# Patient Record
Sex: Female | Born: 1945 | Race: White | Hispanic: No | Marital: Married | Smoking: Never smoker
Health system: Southern US, Community
[De-identification: ages and names within clinical notes are randomized; demographics above are authoritative.]

## PROBLEM LIST (undated history)

## (undated) DIAGNOSIS — E119 Type 2 diabetes mellitus without complications: Secondary | ICD-10-CM

## (undated) DIAGNOSIS — E785 Hyperlipidemia, unspecified: Secondary | ICD-10-CM

## (undated) DIAGNOSIS — M179 Osteoarthritis of knee, unspecified: Secondary | ICD-10-CM

## (undated) DIAGNOSIS — M1710 Unilateral primary osteoarthritis, unspecified knee: Secondary | ICD-10-CM

## (undated) DIAGNOSIS — I10 Essential (primary) hypertension: Secondary | ICD-10-CM

## (undated) HISTORY — DX: Unilateral primary osteoarthritis, unspecified knee: M17.10

## (undated) HISTORY — DX: Type 2 diabetes mellitus without complications: E11.9

## (undated) HISTORY — DX: Hyperlipidemia, unspecified: E78.5

## (undated) HISTORY — DX: Osteoarthritis of knee, unspecified: M17.9

---

## 2001-11-06 ENCOUNTER — Encounter: Payer: Self-pay | Admitting: Family Medicine

## 2001-11-06 ENCOUNTER — Ambulatory Visit (HOSPITAL_COMMUNITY): Admission: RE | Admit: 2001-11-06 | Discharge: 2001-11-06 | Payer: Self-pay | Admitting: Family Medicine

## 2002-11-19 ENCOUNTER — Encounter: Payer: Self-pay | Admitting: Family Medicine

## 2002-11-19 ENCOUNTER — Ambulatory Visit (HOSPITAL_COMMUNITY): Admission: RE | Admit: 2002-11-19 | Discharge: 2002-11-19 | Payer: Self-pay | Admitting: Family Medicine

## 2003-11-15 ENCOUNTER — Ambulatory Visit (HOSPITAL_COMMUNITY): Admission: RE | Admit: 2003-11-15 | Discharge: 2003-11-15 | Payer: Self-pay | Admitting: Family Medicine

## 2004-11-09 ENCOUNTER — Ambulatory Visit (HOSPITAL_COMMUNITY): Admission: RE | Admit: 2004-11-09 | Discharge: 2004-11-09 | Payer: Self-pay | Admitting: Family Medicine

## 2004-11-19 ENCOUNTER — Ambulatory Visit (HOSPITAL_COMMUNITY): Admission: RE | Admit: 2004-11-19 | Discharge: 2004-11-19 | Payer: Self-pay | Admitting: Family Medicine

## 2004-12-26 ENCOUNTER — Ambulatory Visit (HOSPITAL_COMMUNITY): Admission: RE | Admit: 2004-12-26 | Discharge: 2004-12-26 | Payer: Self-pay | Admitting: Family Medicine

## 2005-07-31 ENCOUNTER — Ambulatory Visit (HOSPITAL_COMMUNITY): Admission: RE | Admit: 2005-07-31 | Discharge: 2005-07-31 | Payer: Self-pay | Admitting: Family Medicine

## 2006-01-29 ENCOUNTER — Ambulatory Visit (HOSPITAL_COMMUNITY): Admission: RE | Admit: 2006-01-29 | Discharge: 2006-01-29 | Payer: Self-pay | Admitting: Family Medicine

## 2010-03-12 ENCOUNTER — Encounter: Payer: Self-pay | Admitting: Orthopedic Surgery

## 2010-03-12 ENCOUNTER — Ambulatory Visit (HOSPITAL_COMMUNITY): Admission: RE | Admit: 2010-03-12 | Discharge: 2010-03-12 | Payer: Self-pay | Admitting: Family Medicine

## 2010-03-13 ENCOUNTER — Ambulatory Visit (HOSPITAL_COMMUNITY): Admission: RE | Admit: 2010-03-13 | Discharge: 2010-03-13 | Payer: Self-pay | Admitting: Family Medicine

## 2010-04-13 ENCOUNTER — Ambulatory Visit (HOSPITAL_COMMUNITY): Admission: RE | Admit: 2010-04-13 | Discharge: 2010-04-13 | Payer: Self-pay | Admitting: Family Medicine

## 2010-04-13 ENCOUNTER — Encounter: Payer: Self-pay | Admitting: Orthopedic Surgery

## 2010-05-10 ENCOUNTER — Encounter: Payer: Self-pay | Admitting: Orthopedic Surgery

## 2010-05-10 ENCOUNTER — Ambulatory Visit (HOSPITAL_COMMUNITY)
Admission: RE | Admit: 2010-05-10 | Discharge: 2010-05-10 | Payer: Self-pay | Source: Home / Self Care | Admitting: Family Medicine

## 2010-06-06 ENCOUNTER — Ambulatory Visit: Payer: Self-pay | Admitting: Orthopedic Surgery

## 2010-06-06 ENCOUNTER — Encounter (INDEPENDENT_AMBULATORY_CARE_PROVIDER_SITE_OTHER): Payer: Self-pay | Admitting: *Deleted

## 2010-06-06 DIAGNOSIS — G5790 Unspecified mononeuropathy of unspecified lower limb: Secondary | ICD-10-CM | POA: Insufficient documentation

## 2010-06-08 ENCOUNTER — Encounter: Payer: Self-pay | Admitting: Orthopedic Surgery

## 2010-06-20 ENCOUNTER — Encounter: Payer: Self-pay | Admitting: Orthopedic Surgery

## 2010-06-22 ENCOUNTER — Telehealth: Payer: Self-pay | Admitting: Orthopedic Surgery

## 2010-07-04 ENCOUNTER — Ambulatory Visit: Payer: Self-pay | Admitting: Orthopedic Surgery

## 2010-07-11 ENCOUNTER — Encounter: Payer: Self-pay | Admitting: Orthopedic Surgery

## 2010-09-24 ENCOUNTER — Ambulatory Visit (HOSPITAL_COMMUNITY)
Admission: RE | Admit: 2010-09-24 | Discharge: 2010-09-24 | Payer: Self-pay | Source: Home / Self Care | Attending: Family Medicine | Admitting: Family Medicine

## 2010-10-02 NOTE — Assessment & Plan Note (Signed)
Summary: 4WK RE-CK LT LEG/BCBS/CAF   Visit Type:  Follow-up Referring Provider:  Dr. Renard Matter Primary Provider:  Dr. Renard Matter  CC:  left leg pain.  History of Present Illness: I saw Sherry Weiss in the office today for a 4 week followup visit.  She is a 65 years old woman with the complaint of:  left leg pain.  MRI L SPINE 05/10/10, [facility]/ this indicated that she had a small facet arthritis at L5 and S1. No other spinal abnormality noted.  L spine xrays 04/13/10, [facility]  Left foot and ankle xrays 03/12/10 [facility]  Meds: Crestor, Kombiqlyze, ASA, Vicodin es given from PCP, did not take, Neurontin 100 mg. Takes Tramadol   She says that she has good days and bad days. She has has some numbness in her calf and foot.  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 However, could be lumbar spine-related.  Recommend increased Neurontin 200 mg at night, continue tramadol.  Since she has no surgical lesion. We will allow her to followup with her medical doctor for further care unless things get worse. She is out of work until the 21st.  Orders: Est. Patient Level II [id number])  Medications Added to Medication List This Visit: 1)  Neurontin 100 Mg Caps (Gabapentin) .... 2  by mouth at bedtime  Patient Instructions: 1)  Increase your neurontin to 200 mg at night  2)  continue the tramadol  3)  f/u with Dr. Renard Matter as needed  Prescriptions: NEURONTIN 100 MG CAPS (GABAPENTIN) 2  by mouth at bedtime  #60 x 1   Entered and Authorized by:   Fuller Canada MD   Signed by:   Fuller Canada MD on 07/04/2010   Method used:   Faxed to ...       [facility]* (retail)       [address]       [place], [place]  [address]       Ph: [phone] or  [phone]       Fax: [phone]   RxID:   [id number]    Orders Added: 1)  Est. Patient Level II [66440]

## 2010-10-02 NOTE — Letter (Signed)
Summary: Out of Work  [facility]  [address]  [place], [place] [address]   Phone: [phone]  Fax: [phone]    July 04, 2010   Employee:  Marylouise Stacks P Sami    To Whom It May Concern:   For Medical reasons, please excuse the above named employee from work for the following dates:  Start:   Nov 5   End:   Nov 21st   If you need additional information, please feel free to contact our office.         Sincerely,    Fuller Canada MD

## 2010-10-02 NOTE — Letter (Signed)
Summary: [employer] dis[employer] form  [employer] dis[employer] form   Imported By: Cammie Sickle 07/10/2010 17:56:47  _____________________________________________________________________  External Attachment:    Type:   Image     Comment:   External Document

## 2010-10-02 NOTE — Letter (Signed)
Summary: [employer] dis[employer] form  [employer] form   Imported By: Cammie Sickle 07/10/2010 17:57:49  _____________________________________________________________________  External Attachment:    Type:   Image     Comment:   External Document

## 2010-10-02 NOTE — Assessment & Plan Note (Signed)
Summary: EVAL/TREAT LT LEG PAIN,RAD DOWN TO FOOT/NEED XRAY/BRING'G MRI...   Vital Signs:  Patient profile:   65 year old female Height:      59 inches Weight:      123 pounds Pulse rate:   70 / minute Resp:     16 per minute  Vitals Entered By: Fuller Canada MD (June 06, 2010 10:09 AM)  Visit Type:  new patient Referring Colen Eltzroth:  Dr. Renard Matter Primary Tristram Milian:  Dr. Renard Matter  CC:  left leg pain.  History of Present Illness: I saw Sherry Weiss in the office today for an initial visit.  She is a 65 years old woman with the complaint of:  left leg pain.  MRI L SPINE 05/10/10, [facility]  L spine xrays 04/13/10, [facility]  Left foot and ankle xrays 03/12/10 [facility]  Meds: Crestor, Kombiqlyze, ASA, Vicodin es given from PCP, did not take, Advil 400mg  helps some.  Very interesting case and referral.  The patient complains of pain in her LEFT leg and LEFT foot which radiates.  Its described as throbbing and burning and reaches a level of 10.  It has become constant it started about 2 months ago when she fell forward while moving a heavy object.  At that time she felt pain in her back and since that time his had some numbness in her toe is well.  That's the great toe the LEFT foot.  She says nothing makes it better standing and walking make it worse.  She was treated with Advil 400 mg mild relief and Aleve as well.  She did not take the Vicodin ES.  Denies catching locking of the back she does note some LEFT leg weakness has no cramps.  No fever night sweats;   weight loss is a note unexpected weight loss.  No weight gain.  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  Review of Systems Constitutional:  Complains of weight loss; denies weight gain, fever, chills, and fatigue. Cardiovascular:  Denies chest pain, palpitations, fainting, and murmurs. Respiratory:  Denies short of breath, wheezing, couch, tightness, pain on inspiration, and snoring . Gastrointestinal:  Denies heartburn, nausea, vomiting, diarrhea, constipation, and blood in your stools. Genitourinary:  Denies frequency, urgency, difficulty urinating, painful urination, flank pain, and bleeding in urine. Neurologic:  Denies numbness, tingling, unsteady gait, dizziness, tremors, and seizure. Musculoskeletal:  Complains of joint pain and muscle pain; denies swelling, instability, stiffness, redness, and heat. Endocrine:  Denies excessive thirst, exessive urination, and heat or cold intolerance. Psychiatric:  Denies nervousness, depression, anxiety, and hallucinations. Skin:  Denies changes in the skin, poor healing, rash, itching, and redness. HEENT:  Denies blurred or double vision, eye pain, redness, and watering. Immunology:  Denies seasonal allergies, sinus problems, and allergic to bee stings. Hemoatologic:  Denies easy bleeding and brusing.  Physical Exam  Additional Exam:  GEN: well developed, well nourished, normal grooming and hygiene, no deformity and normal body habitus.   CDV: pulses are normal, no edema, no erythema. no tenderness  Lymph: normal  lymph nodes   Skin: no rashes, skin lesions or open sores   NEURO: normal coordination  Psyche: awake, alert and oriented. Mood normal   Gait: his normal  She has no tenderness in her lumbar spine.  Her lower extremities reveal no abnormality to inspection she has full range of motion of all joints she has normal strength in the lower extremities with no instability or subluxation noted  She does have an  asymmetry of reflexes and decreased sensation in the great toe and between the first and second digit of LEFT foot  she has some symptoms on the straight leg raise maneuver on the LEFT none on the RIGHT.   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  Recommend Neurontin 100 mg at night come back 4 weeks.  She says she can't work she can't walk she can't stand so she would like to be out of work so I agreed based on her request  Orders: New Patient Level III ([address])  Medications Added to Medication List This Visit: 1)  Neurontin 100 Mg Caps (Gabapentin) .Marland Kitchen.. 1 by mouth at bedtime  Patient Instructions: 1)  Take new med at bedtime for nerve pain in your leg 2)  come back in 4 weeks 3)  Out of work for 4 weeks Prescriptions: NEURONTIN 100 MG CAPS (GABAPENTIN) 1 by mouth at bedtime  #60 x 2   Entered and Authorized by:   Fuller Canada MD   Signed by:   Fuller Canada MD on 06/06/2010   Method used:   Faxed to ...       [facility]* (retail)       [address]       [place], [place]  [address]       Ph: [phone] or [phone]       Fax: [phone]   RxID:   [id number]

## 2010-10-02 NOTE — Letter (Signed)
Summary: History form  History form   Imported By: Jacklynn Ganong 06/08/2010 08:26:57  _____________________________________________________________________  External Attachment:    Type:   Image     Comment:   External Document

## 2010-10-02 NOTE — Progress Notes (Signed)
Summary: wants pain medicine  Phone Note Call from Patient   Summary of Call: Sherry Weiss (Jan 15, 2046) says  Advil and Aleve is not helping her foot pain.  Ask if you will prescribe a mild pain medicine, does not want anything that will make her drowsy.  Uses [facility]. Her # [id number] Initial call taken by: Jacklynn Ganong,  June 22, 2010 10:06 AM  Follow-up for Phone Call        call her doctor  Follow-up by: Fuller Canada MD,  June 25, 2010 7:37 AM  Additional Follow-up for Phone Call Additional follow up Details #1::        Advised the patient of reply Additional Follow-up by: Jacklynn Ganong,  June 25, 2010 9:17 AM

## 2010-10-02 NOTE — Letter (Signed)
Summary: Office note faxed to [employer]  Office note faxed to [employer]   Imported By: Jacklynn Ganong 07/11/2010 12:05:20  _____________________________________________________________________  External Attachment:    Type:   Image     Comment:   External Document

## 2010-10-02 NOTE — Letter (Signed)
Summary: Out of Work  [facility]  [address]  [place], [place] [address]   Phone: [phone]  Fax: [phone]    June 06, 2010   Employee:  Sherry Weiss    To Whom It May Concern:   For Medical reasons, please excuse the above named employee from work for the following dates:  Start:   06/06/10  End:   07/04/10  Next scheduled appointment:    07/04/10  Approximate return to work date:  07/05/10  If you need additional information, please feel free to contact our office.         Sincerely,    Terrance Mass, MD

## 2010-10-02 NOTE — Letter (Signed)
Summary: *Orthopedic Consult Note  [facility]  [address]  [place], [place] [address]   Phone: [phone]  Fax: [phone]    Re:    ELVIA AYDIN DOB:    1946-04-22   Dear: Thalia Party    Thank you for requesting that we see the above patient for consultation.  A copy of the detailed office note will be sent under separate cover, for your review.  Evaluation today is consistent with: neuropathy left leg    Our recommendation is for: Neurontin 200 mg hs        Thank you for this opportunity to look after your patient.  Sincerely,   Terrance Mass. MD.

## 2010-10-23 ENCOUNTER — Other Ambulatory Visit (HOSPITAL_COMMUNITY): Payer: Self-pay | Admitting: Family Medicine

## 2010-10-23 ENCOUNTER — Ambulatory Visit (HOSPITAL_COMMUNITY)
Admission: RE | Admit: 2010-10-23 | Discharge: 2010-10-23 | Disposition: A | Discharge status: Home / Self Care | Payer: BC Managed Care – PPO | Source: Ambulatory Visit | Attending: Family Medicine | Admitting: Family Medicine

## 2010-10-23 ENCOUNTER — Encounter (HOSPITAL_COMMUNITY): Payer: Self-pay

## 2010-10-23 DIAGNOSIS — S8990XA Unspecified injury of unspecified lower leg, initial encounter: Secondary | ICD-10-CM | POA: Insufficient documentation

## 2010-10-23 DIAGNOSIS — T1490XA Injury, unspecified, initial encounter: Secondary | ICD-10-CM

## 2010-10-23 DIAGNOSIS — X58XXXA Exposure to other specified factors, initial encounter: Secondary | ICD-10-CM | POA: Insufficient documentation

## 2011-01-02 ENCOUNTER — Emergency Department (HOSPITAL_COMMUNITY): Payer: BC Managed Care – PPO

## 2011-01-02 ENCOUNTER — Emergency Department (HOSPITAL_COMMUNITY)
Admission: EM | Admit: 2011-01-02 | Discharge: 2011-01-02 | Disposition: A | Discharge status: Home / Self Care | Payer: BC Managed Care – PPO | Attending: Emergency Medicine | Admitting: Emergency Medicine

## 2011-01-02 DIAGNOSIS — W19XXXA Unspecified fall, initial encounter: Secondary | ICD-10-CM | POA: Insufficient documentation

## 2011-01-02 DIAGNOSIS — Z79899 Other long term (current) drug therapy: Secondary | ICD-10-CM | POA: Insufficient documentation

## 2011-01-02 DIAGNOSIS — S8000XA Contusion of unspecified knee, initial encounter: Secondary | ICD-10-CM | POA: Insufficient documentation

## 2011-01-02 DIAGNOSIS — Z7982 Long term (current) use of aspirin: Secondary | ICD-10-CM | POA: Insufficient documentation

## 2011-01-02 DIAGNOSIS — E119 Type 2 diabetes mellitus without complications: Secondary | ICD-10-CM | POA: Insufficient documentation

## 2011-01-02 DIAGNOSIS — E7800 Pure hypercholesterolemia, unspecified: Secondary | ICD-10-CM | POA: Insufficient documentation

## 2011-01-16 ENCOUNTER — Encounter: Payer: Self-pay | Admitting: Orthopedic Surgery

## 2011-01-16 ENCOUNTER — Ambulatory Visit (INDEPENDENT_AMBULATORY_CARE_PROVIDER_SITE_OTHER): Payer: BC Managed Care – PPO | Admitting: Orthopedic Surgery

## 2011-01-16 DIAGNOSIS — S82109A Unspecified fracture of upper end of unspecified tibia, initial encounter for closed fracture: Secondary | ICD-10-CM

## 2011-01-16 NOTE — Progress Notes (Signed)
Chief complaint pain, LEFT knee.  65 year old female presents with a new problem. After a fall on May 2 injured her LEFT knee. ER x-ray shows a tibial tubercle nondisplaced fracture.  Patient was placed in a knee immobilizer, which she wore for about a week and a half and placed herself in a flexible knee brace and she is using her cane. She complains of sharp constant pain, which is 9/10 associated with a significant amount of bruising and swelling in the leg. Over the last 3-5 days however, the symptoms have started to get better. She has a medical history of diabetes and high cholesterol. She said 2 cesarean sections. She takes Crestor, gabapentin, meloxicam, Cymbalta, hydrocodone, Bayer aspirin.  Has a family history of heart disease, lung disease, cancer, and diabetes.  She is married. Does not smoke or drink.  We've seen her in the past for lumbar facet arthritis.  Review of systems all were negative, except for the musculoskeletal  Her frame is small. She has normal appearance.  She is oriented x3,  She's noted to have a normal mood and affect.  He is ambulatory with a cane.  She has full extension of the LEFT knee with tenderness over the tubercle. She has good manual muscle testing. Strength of her extensor mechanism in her knee is stable in the anteroposterior direction. Skin is present ecchymotic, but intact pulse and temperature are normal. Sensation is normal as well. Balance is excellent.  Hospital film shows tibial tubercle nondisplaced what appears to be a fracture.  Recommend continue brace and cane for 4 additional weeks and return for reexamination of the knee expect her to be healed by that point.

## 2011-02-13 ENCOUNTER — Ambulatory Visit (INDEPENDENT_AMBULATORY_CARE_PROVIDER_SITE_OTHER): Payer: Self-pay | Admitting: Orthopedic Surgery

## 2011-02-13 DIAGNOSIS — S82109A Unspecified fracture of upper end of unspecified tibia, initial encounter for closed fracture: Secondary | ICD-10-CM

## 2011-02-13 DIAGNOSIS — IMO0001 Reserved for inherently not codable concepts without codable children: Secondary | ICD-10-CM | POA: Insufficient documentation

## 2011-02-13 NOTE — Progress Notes (Signed)
   Routine followup visit  Fracture tibial tubercle  Treatment brace  Complains of mild pain anterior tibia  Clinical exam shows full extension and normal extension power with a stable ligament exam.  Ambulate without support.  Full range of motion.  Skin intact sensation normal patient awake and alert  Tibial tubercle type fracture.  Healed.  Resume normal activities in a gradual manner

## 2011-02-13 NOTE — Patient Instructions (Signed)
Remove brace from left knee

## 2011-04-08 ENCOUNTER — Other Ambulatory Visit (HOSPITAL_COMMUNITY): Payer: Self-pay | Admitting: Family Medicine

## 2011-04-08 DIAGNOSIS — Z139 Encounter for screening, unspecified: Secondary | ICD-10-CM

## 2011-04-15 ENCOUNTER — Ambulatory Visit (HOSPITAL_COMMUNITY)
Admission: RE | Admit: 2011-04-15 | Discharge: 2011-04-15 | Disposition: A | Discharge status: Home / Self Care | Payer: Medicare Other | Source: Ambulatory Visit | Attending: Family Medicine | Admitting: Family Medicine

## 2011-04-15 DIAGNOSIS — Z1231 Encounter for screening mammogram for malignant neoplasm of breast: Secondary | ICD-10-CM | POA: Insufficient documentation

## 2011-04-15 DIAGNOSIS — Z139 Encounter for screening, unspecified: Secondary | ICD-10-CM

## 2011-04-17 ENCOUNTER — Other Ambulatory Visit: Payer: Self-pay | Admitting: Family Medicine

## 2011-04-17 DIAGNOSIS — R928 Other abnormal and inconclusive findings on diagnostic imaging of breast: Secondary | ICD-10-CM

## 2011-05-01 ENCOUNTER — Ambulatory Visit (HOSPITAL_COMMUNITY)
Admission: RE | Admit: 2011-05-01 | Discharge: 2011-05-01 | Disposition: A | Discharge status: Home / Self Care | Payer: Medicare Other | Source: Ambulatory Visit | Attending: Family Medicine | Admitting: Family Medicine

## 2011-05-01 ENCOUNTER — Other Ambulatory Visit (HOSPITAL_COMMUNITY): Payer: Self-pay | Admitting: Family Medicine

## 2011-05-01 DIAGNOSIS — R928 Other abnormal and inconclusive findings on diagnostic imaging of breast: Secondary | ICD-10-CM | POA: Insufficient documentation

## 2011-05-16 ENCOUNTER — Other Ambulatory Visit (HOSPITAL_COMMUNITY): Payer: Self-pay | Admitting: Family Medicine

## 2011-05-16 DIAGNOSIS — I739 Peripheral vascular disease, unspecified: Secondary | ICD-10-CM

## 2011-05-16 DIAGNOSIS — M79606 Pain in leg, unspecified: Secondary | ICD-10-CM

## 2011-05-17 ENCOUNTER — Ambulatory Visit (HOSPITAL_COMMUNITY)
Admission: RE | Admit: 2011-05-17 | Discharge: 2011-05-17 | Disposition: A | Discharge status: Home / Self Care | Payer: Medicare Other | Source: Ambulatory Visit | Attending: Family Medicine | Admitting: Family Medicine

## 2011-05-17 ENCOUNTER — Other Ambulatory Visit (HOSPITAL_COMMUNITY): Payer: Self-pay | Admitting: Family Medicine

## 2011-05-17 DIAGNOSIS — M25569 Pain in unspecified knee: Secondary | ICD-10-CM | POA: Insufficient documentation

## 2011-05-17 DIAGNOSIS — I739 Peripheral vascular disease, unspecified: Secondary | ICD-10-CM

## 2011-05-17 DIAGNOSIS — G8929 Other chronic pain: Secondary | ICD-10-CM

## 2011-05-17 DIAGNOSIS — R937 Abnormal findings on diagnostic imaging of other parts of musculoskeletal system: Secondary | ICD-10-CM | POA: Insufficient documentation

## 2011-05-17 DIAGNOSIS — R52 Pain, unspecified: Secondary | ICD-10-CM

## 2011-05-17 DIAGNOSIS — M79609 Pain in unspecified limb: Secondary | ICD-10-CM | POA: Insufficient documentation

## 2011-05-31 ENCOUNTER — Other Ambulatory Visit (HOSPITAL_COMMUNITY): Payer: Self-pay | Admitting: Family Medicine

## 2011-05-31 DIAGNOSIS — M25569 Pain in unspecified knee: Secondary | ICD-10-CM

## 2011-06-05 ENCOUNTER — Ambulatory Visit (HOSPITAL_COMMUNITY)
Admission: RE | Admit: 2011-06-05 | Discharge: 2011-06-05 | Disposition: A | Discharge status: Home / Self Care | Payer: Medicare Other | Source: Ambulatory Visit | Attending: Family Medicine | Admitting: Family Medicine

## 2011-06-05 DIAGNOSIS — M25569 Pain in unspecified knee: Secondary | ICD-10-CM | POA: Insufficient documentation

## 2011-06-27 ENCOUNTER — Ambulatory Visit (INDEPENDENT_AMBULATORY_CARE_PROVIDER_SITE_OTHER): Payer: Medicare Other | Admitting: Orthopedic Surgery

## 2011-06-27 ENCOUNTER — Encounter: Payer: Self-pay | Admitting: Orthopedic Surgery

## 2011-06-27 VITALS — BP 104/64 | Ht 59.0 in | Wt 112.2 lb

## 2011-06-27 DIAGNOSIS — M1710 Unilateral primary osteoarthritis, unspecified knee: Secondary | ICD-10-CM

## 2011-06-28 ENCOUNTER — Encounter: Payer: Self-pay | Admitting: Orthopedic Surgery

## 2011-06-28 DIAGNOSIS — M1710 Unilateral primary osteoarthritis, unspecified knee: Secondary | ICD-10-CM | POA: Insufficient documentation

## 2011-06-28 NOTE — Progress Notes (Signed)
   65 year old female presents with bilateral knee pain RIGHT worse than LEFT worse after an injury probably 7 months ago.  She complains of 7/10 throbbing constant pain which is worse with standing and worse with activities of daily living  She listed her review of systems as negative but she does have some leg pain in her proximal and distal tibia there is question of whether this radiates.  She has a history of diabetes and hypertension  She's had 2 cesarean sections  She uses [facility]  She is followed by Dr. Lum Keas  She has heart disease arthritis cancer and diabetes listed as family history  She is married retired and has no social habits.  Physical exam vital signs are stable.  Her general appearance is normal she has a small frame she's well groomed hygiene is normal  She is oriented x3 with normal mood and affect  Her ambulation is remarkable for a cane which is used to support her gait.  Her stride length is decreased and her speed is decreased.  Both knees are very similar.  On inspection she has medial joint line tenderness bilaterally.  She has maintained her range of motion of 125 bilaterally.  Both knees are stable have normal strength and skin is normal  Distally she has normal pulse and temperature without edema or swelling in both lower extremities.  Sensation is normal she has no lymphadenopathy she is equal and normal reflexes and normal coordination  X-rays were done at the hospital and include an MRI of her LEFT knee.  She basically has osteoarthritis of both knees on the LEFT knee MRI we do see she has degeneration of the posterior horn of the medial meniscus but her main problem remains arthritis.  She is given options of nonoperative treatment vs. Surgical treatment.  She can't have surgery right now due to some issues at home that she would like to resolve first.  She will come back and schedule surgery at her for a RIGHT total knee arthroplasty.   Note she may switch to the LEFT side first as that is bothering her more at that time.

## 2011-07-10 ENCOUNTER — Encounter: Payer: Self-pay | Admitting: Cardiology

## 2011-07-11 ENCOUNTER — Encounter: Payer: Self-pay | Admitting: Cardiology

## 2011-07-11 ENCOUNTER — Ambulatory Visit (INDEPENDENT_AMBULATORY_CARE_PROVIDER_SITE_OTHER): Payer: Medicare Other | Admitting: Cardiology

## 2011-07-11 VITALS — BP 106/68 | HR 108 | Ht 59.0 in | Wt 113.0 lb

## 2011-07-11 DIAGNOSIS — I951 Orthostatic hypotension: Secondary | ICD-10-CM | POA: Insufficient documentation

## 2011-07-11 DIAGNOSIS — E782 Mixed hyperlipidemia: Secondary | ICD-10-CM | POA: Insufficient documentation

## 2011-07-11 NOTE — Assessment & Plan Note (Signed)
Seems to be symptomatic, documented by Dr. Renard Matter, and also by [place] today. Wonder whether this could be a side effect of Cymbalta, particularly as it is a relatively new medication within the last few months based on patient's report. Plan will be to have her wean off of this medicine over the next 2 weeks, and see how she fares symptomatically in terms of blood pressure and dizziness. Office followup is arranged. If symptoms persist, may need to consider further evaluation and/or a volume expander.

## 2011-07-11 NOTE — Progress Notes (Signed)
Clinical Summary Sherry Weiss is a 65 y.o.female referred for cardiology consultation by Dr. Renard Matter. She reports a history of primarily orthostatic lightheadedness and near syncope over the last few months at least. No specific palpitations, chest pain, or shortness of breath. We reviewed her medications, she states that she has been on Cymbalta for the last few months. She has been noted to be orthostatic on recent evaluation by Dr. Renard Matter, and again today, associated with symptoms of lightheadedness when she went from supine position to seated position.  ECG is reviewed below. We discussed her general diet and fluid intake pattern. She is also on oral diabetic agents.   No Known Allergies  Medication list reviewed.  Past Medical History  Diagnosis Date  . Type 2 diabetes mellitus   . Hyperlipidemia   . Osteoarthritis, knee     Past Surgical History  Procedure Date  . Cesarean section     Family History  Problem Relation Age of Onset  . Heart disease    . Arthritis    . Cancer    . Diabetes      Social History Ms. Philipp reports that she has never smoked. She has never used smokeless tobacco. Ms. Tewksbury reports that she does not drink alcohol.  Review of Systems No specific palpitations or frank syncope. Has problems with arthritis affecting her knees, may need surgery. Otherwise reviewed and negative except as outlined.  Physical Examination Filed Vitals:   07/11/11 1518  BP: 106/68  Pulse: 108   Thin woman in no acute distress. HEENT: Conjunctiva and lids normal, oropharynx with moist mucosa. Neck: Supple, no elevated JVP or carotid bruits. Lungs: Clear to auscultation, nonlabored. Cardiac: Regular rate and rhythm, no S3 gallop or rub, soft S4. Abdomen: Soft, nontender, bowel sounds present. Skin: Warm and dry. Extremities: No pitting edema, distal pulses 1-2+. Musculoskeletal: No kyphosis. Neuropsychiatric: Alert and oriented x3, pleasant, moves all extremities  equally. Uses a cane to ambulate.  ECG Sinus tachycardia at 105 beats per minute, PR interval 98 ms. No definite preexcitation pattern.    Problem List and Plan

## 2011-07-11 NOTE — Patient Instructions (Signed)
Your physician recommends that you schedule a follow-up appointment in: 1 month  Your physician has recommended you make the following change in your medication:   Take Cymbalta 60 mg every other day for 2 weeks and then stop

## 2011-07-11 NOTE — Assessment & Plan Note (Signed)
On statin therapy, followed by Dr. Renard Matter.

## 2011-07-11 NOTE — Assessment & Plan Note (Signed)
On oral therapy, followed by Dr. Renard Matter.

## 2011-08-06 ENCOUNTER — Encounter: Payer: Self-pay | Admitting: Cardiology

## 2011-08-12 ENCOUNTER — Encounter: Payer: Self-pay | Admitting: Cardiology

## 2011-08-12 ENCOUNTER — Ambulatory Visit (INDEPENDENT_AMBULATORY_CARE_PROVIDER_SITE_OTHER): Payer: Medicare Other | Admitting: Cardiology

## 2011-08-12 DIAGNOSIS — I951 Orthostatic hypotension: Secondary | ICD-10-CM

## 2011-08-12 NOTE — Patient Instructions (Addendum)
Your physician recommends that you continue on your current medications as directed. Please refer to the Current Medication list given to you today.  Your physician recommends that you schedule a follow-up appointment in: as needed  

## 2011-08-12 NOTE — Assessment & Plan Note (Signed)
Keep followup with Dr. Renard Matter.

## 2011-08-12 NOTE — Progress Notes (Signed)
Clinical Summary Sherry Weiss is a 65 y.o.female presenting for followup. She was seen in Novermber. Since last visit she has weaned off Cymbalta. She reports improvement in dizziness.  No chest pain or palpitations. She states that has pending orthopedic evaluation due to knee pain and may need surgery.  Recheck orthostatics today looked better.  No Known Allergies  Medication list reviewed.  Past Medical History  Diagnosis Date  . Type 2 diabetes mellitus   . Hyperlipidemia   . Osteoarthritis, knee     Social History Sherry Weiss reports that she has never smoked. She has never used smokeless tobacco. Sherry Weiss reports that she does not drink alcohol.  Review of Systems Fair appetite. No syncope. Otherwise negative except as outlined.  Physical Examination Filed Vitals:   08/12/11 1401  BP: 143/77  Pulse: 103   Thin woman in no acute distress.  HEENT: Conjunctiva and lids normal, oropharynx with moist mucosa.  Neck: Supple, no elevated JVP or carotid bruits.  Lungs: Clear to auscultation, nonlabored.  Cardiac: Regular rate and rhythm, no S3 gallop or rub, soft S4.  Abdomen: Soft, nontender, bowel sounds present.  Skin: Warm and dry.  Extremities: No pitting edema, distal pulses 1-2+.  Musculoskeletal: No kyphosis.  Neuropsychiatric: Alert and oriented x3, pleasant, moves all extremities equally. Uses a cane to ambulate.    Problem List and Plan

## 2011-08-12 NOTE — Assessment & Plan Note (Signed)
She is better symptomatically since off Cymbalta. Likely has component of autonomic dysfunction with diabetes, although does not seem to necessarily need medication now (volume expander). Maintain adequate hydration. Followup with Dr. Renard Matter. We can see her back as needed.

## 2011-08-15 ENCOUNTER — Encounter: Payer: Self-pay | Admitting: Orthopedic Surgery

## 2011-08-15 ENCOUNTER — Ambulatory Visit (INDEPENDENT_AMBULATORY_CARE_PROVIDER_SITE_OTHER): Payer: Medicare Other | Admitting: Orthopedic Surgery

## 2011-08-15 ENCOUNTER — Ambulatory Visit (HOSPITAL_COMMUNITY)
Admission: RE | Admit: 2011-08-15 | Discharge: 2011-08-15 | Disposition: A | Discharge status: Home / Self Care | Payer: Medicare Other | Source: Ambulatory Visit | Attending: Orthopedic Surgery | Admitting: Orthopedic Surgery

## 2011-08-15 VITALS — BP 118/70 | Ht 59.0 in | Wt 110.0 lb

## 2011-08-15 DIAGNOSIS — M545 Low back pain, unspecified: Secondary | ICD-10-CM | POA: Insufficient documentation

## 2011-08-15 DIAGNOSIS — IMO0002 Reserved for concepts with insufficient information to code with codable children: Secondary | ICD-10-CM

## 2011-08-15 DIAGNOSIS — M5410 Radiculopathy, site unspecified: Secondary | ICD-10-CM

## 2011-08-15 DIAGNOSIS — N200 Calculus of kidney: Secondary | ICD-10-CM | POA: Insufficient documentation

## 2011-08-15 MED ORDER — PREDNISONE 10 MG PO KIT: PACK | ORAL | Status: DC

## 2011-08-15 NOTE — Patient Instructions (Signed)
Go to hospital for back x rays   Start physical therapy   Pick up new script at Pharmacy

## 2011-08-15 NOTE — Progress Notes (Signed)
Previous visit:  X-rays were done at the hospital and include an MRI of her LEFT knee. She basically has osteoarthritis of both knees on the LEFT knee MRI we do see she has degeneration of the posterior horn of the medial meniscus but her main problem remains arthritis.  She is given options of nonoperative treatment vs. Surgical treatment. She can't have surgery right now due to some issues at home that she would like to resolve first. She will come back and schedule surgery at her for a RIGHT total knee arthroplasty. Note she may switch to the LEFT side first as that is bothering her more at that time.  She is now complaining of pain from her knee down to her ankle with dorsal foot pain and lateral leg pain.  She denies any back pain.  She denies pain radiating from the back across the thigh.  Previous x-rays were obtained as noted above along with MRI  I thought she may have needed a knee replacement that time her symptoms now are more burning in the areas mentioned  Recommend lumbar series to evaluate her back and then proceed with gabapentin held with the burning.  I don't think knee replacement needed at this time

## 2011-08-29 ENCOUNTER — Ambulatory Visit (HOSPITAL_COMMUNITY)
Admission: RE | Admit: 2011-08-29 | Discharge: 2011-08-29 | Disposition: A | Discharge status: Home / Self Care | Payer: Medicare Other | Source: Ambulatory Visit | Attending: Orthopedic Surgery | Admitting: Orthopedic Surgery

## 2011-08-29 DIAGNOSIS — R269 Unspecified abnormalities of gait and mobility: Secondary | ICD-10-CM | POA: Insufficient documentation

## 2011-08-29 DIAGNOSIS — IMO0001 Reserved for inherently not codable concepts without codable children: Secondary | ICD-10-CM | POA: Insufficient documentation

## 2011-08-29 DIAGNOSIS — M25569 Pain in unspecified knee: Secondary | ICD-10-CM | POA: Insufficient documentation

## 2011-08-29 DIAGNOSIS — M6281 Muscle weakness (generalized): Secondary | ICD-10-CM | POA: Insufficient documentation

## 2011-08-29 DIAGNOSIS — R29898 Other symptoms and signs involving the musculoskeletal system: Secondary | ICD-10-CM | POA: Insufficient documentation

## 2011-08-29 DIAGNOSIS — E119 Type 2 diabetes mellitus without complications: Secondary | ICD-10-CM | POA: Insufficient documentation

## 2011-08-29 DIAGNOSIS — R262 Difficulty in walking, not elsewhere classified: Secondary | ICD-10-CM | POA: Insufficient documentation

## 2011-08-29 DIAGNOSIS — Z9181 History of falling: Secondary | ICD-10-CM | POA: Insufficient documentation

## 2011-08-29 NOTE — Patient Instructions (Signed)
HEP

## 2011-08-29 NOTE — Progress Notes (Signed)
Physical Therapy Evaluation  Patient Details  Name: Sherry Weiss MRN: [id number] Date of Birth: Jun 20, 1946  Today's Date: 08/29/2011 Time: 8295-6213 Time Calculation (min): 48 min Visit#: 1  of 18   Re-eval: 09/28/11 Assessment Diagnosis: radicular back pain. Next MD Visit: after therapy Prior Therapy: none  Past Medical History:  Past Medical History  Diagnosis Date  . Type 2 diabetes mellitus   . Hyperlipidemia   . Osteoarthritis, knee    Past Surgical History:  Past Surgical History  Procedure Date  . Cesarean section     Subjective Symptoms/Limitations Symptoms: Sherry Weiss states that about a year ago she started to have pain in her leg.  She states that lately she has been walking and her leg wll just give out.  She states that she started using her cane about a month ago due to falling .   She states that the pain in her right leg is constant from the knee sown into her foot.  She had an ultrsound and a MRI.  She is now being referred for therapy to improve her safety and mobility. Limitations: Sitting;Lifting;Standing;Walking;House hold activities How long can you sit comfortably?: Sitting is comfortable for an hour. How long can you stand comfortably?: The patient states that she is able to stand for an hour. How long can you walk comfortably?: The patient states that she has increased pain when walking.  She walks with her cane for anywhere from thrity minutes to two hours depending on if she is having a good day or a bad day. Special Tests: Pt is unable to go up or down steps without holding. 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  Pt had to retire due to not being able to complete her work duties.       Assessment RLE Strength Right Hip Flexion: 3/5 Right Hip Extension: 2/5 Right Hip ABduction: 3-/5 Right Hip ADduction: 3/5 Right Knee Flexion: 2+/5 Right Knee Extension: 3+/5 Right Ankle Dorsiflexion: 3/5 Right Ankle Plantar Flexion: 3+/5 LLE Strength Left Hip Flexion: 5/5 Left Hip Extension: 5/5 Left Hip ABduction: 5/5 Left Hip ADduction: 4/5 Left Knee Flexion: 4/5 Left Knee Extension: 3+/5 Left Ankle Dorsiflexion: 3/5 Left Ankle Plantar Flexion: 3+/5 Lumbar AROM Lumbar Flexion:  (wnl) Lumbar Extension: wnl Lumbar - Right Side Bend:  (wnl) Lumbar - Left Side Bend: wnl Lumbar - Right Rotation: wnl Lumbar - Left Rotation: wnl  Exercise/Treatments Stretches Active Hamstring Stretch: 3 reps;30 seconds Single Knee to Chest Stretch: 3 reps;30 seconds Lumbar Exercises   Stability Straight Leg Raise: Supine;10 reps Hip Abduction: Side-lying;5 reps Heel Raises: 10 reps;Limitations Heel Raises Limitations: sitting with toe raises Lifting: Limitations Lifting Limitations: prone knee flexion R x 10       Physical Therapy Assessment and Plan PT Assessment and Plan Clinical Impression Statement: Pt with decreased strength in LE with history of falling who will benefit from skilled PT to improve safety and return pt to maximal functinal potential. Rehab Potential: Good Clinical Impairments Affecting Rehab Potential: weakness, pain, stiffness    Goals Home Exercise Program Pt will Perform Home Exercise Program: Independently PT Short Term Goals Time to Complete Short Term Goals: 3 weeks PT Short Term Goal 1: MM strength increased 1/2 grade to allow pt  to feel stable enough to walk in the house without a cane. PT Short Term Goal 2: Pain no greater than a 5/10 PT Long Term Goals Time to Complete Long Term Goals:  (6 weeks) PT Long Term Goal 1: I in Advance HEP PT Long Term Goal 2: MM strength increased one grade. Long Term Goal 3: Pt to report no falls Long Term Goal  4: Pain no greater than a 2/10 PT Long Term Goal 5: able to ambulate in community without an assistive device.  Problem List Patient Active Problem List  Diagnoses  . UNSPECIFIED MONONEURITIS OF LOWER LIMB  . Closed fracture of tibial tubercle  . Osteoarthritis, knee  . Orthostatic hypotension  . Type II or unspecified type diabetes mellitus without mention of complication, uncontrolled  . Mixed hyperlipidemia  . Right leg weakness  . History of falling  . Abnormality of gait    PT - End of Session Activity Tolerance: Patient tolerated treatment well General Behavior During Session: [facility] for tasks performed Cognition: [facility] for tasks performed   Sebastyan Snodgrass,CINDY 08/29/2011, 11:16 AM  Physician Documentation Your signature is required to indicate approval of the treatment plan as stated above.  Please sign and either send electronically or make a copy of this report for your files and return this physician signed original.   Please mark one 1.__approve of plan  2. ___approve of plan with the following conditions.   ______________________________                                                          _____________________ Physician Signature                                                                                                             Date

## 2011-09-05 ENCOUNTER — Ambulatory Visit (HOSPITAL_COMMUNITY)
Admission: RE | Admit: 2011-09-05 | Discharge: 2011-09-05 | Disposition: A | Discharge status: Home / Self Care | Payer: Medicare Other | Source: Ambulatory Visit | Attending: Orthopedic Surgery | Admitting: Orthopedic Surgery

## 2011-09-05 DIAGNOSIS — M25569 Pain in unspecified knee: Secondary | ICD-10-CM | POA: Insufficient documentation

## 2011-09-05 DIAGNOSIS — IMO0001 Reserved for inherently not codable concepts without codable children: Secondary | ICD-10-CM | POA: Insufficient documentation

## 2011-09-05 DIAGNOSIS — M6281 Muscle weakness (generalized): Secondary | ICD-10-CM | POA: Insufficient documentation

## 2011-09-05 DIAGNOSIS — R262 Difficulty in walking, not elsewhere classified: Secondary | ICD-10-CM | POA: Insufficient documentation

## 2011-09-05 DIAGNOSIS — E119 Type 2 diabetes mellitus without complications: Secondary | ICD-10-CM | POA: Insufficient documentation

## 2011-09-05 NOTE — Progress Notes (Signed)
Physical Therapy Treatment Patient Details  Name: Sherry Weiss MRN: [id number] Date of Birth: 06/04/1946  Today's Date: 09/05/2011 Time: 1478-2956 Time Calculation (min): 39 min Visit#: 2  of 18   Re-eval: 09/28/11 Charges: Therex x 38'  Subjective: Symptoms/Limitations Symptoms: Pt reports pain in the bottom of her feet. Pt states it was worse last night then it is now. Pain Assessment Currently in Pain?: Yes Pain Score:   3 Pain Location: Foot Pain Orientation: Right   Exercise/Treatments Stretches Active Hamstring Stretch: 3 reps;30 seconds Single Knee to Chest Stretch: 3 reps;30 seconds Stability Clam: 10 reps Bridge: 10 reps;3 seconds Bent Knee Raise: 10 reps Ab Set: 10 reps Straight Leg Raise: 10 reps Hip Abduction: 10 reps;Side-lying Heel Raises: 10 reps;Limitations Heel Raises Limitations: toe raises x 10 seated  Physical Therapy Assessment and Plan PT Assessment and Plan Clinical Impression Statement: Pt completes therex with good form and minimal need for cueing. Attempted standing toe raise, pt u/a to complete secondary to weakness. Exercises completed in sitting without difficulty. Pt reports no increase in pain at end of session. PT Plan: Continue to progress per PT POC.     Problem List Patient Active Problem List  Diagnoses  . UNSPECIFIED MONONEURITIS OF LOWER LIMB  . Closed fracture of tibial tubercle  . Osteoarthritis, knee  . Orthostatic hypotension  . Type II or unspecified type diabetes mellitus without mention of complication, uncontrolled  . Mixed hyperlipidemia  . Right leg weakness  . History of falling  . Abnormality of gait    PT - End of Session Activity Tolerance: Patient tolerated treatment well General Behavior During Session: [facility] for tasks performed Cognition: [facility] for tasks performed  [name] 09/05/2011, 10:18 AM

## 2011-09-09 ENCOUNTER — Ambulatory Visit (HOSPITAL_COMMUNITY)
Admission: RE | Admit: 2011-09-09 | Discharge: 2011-09-09 | Disposition: A | Discharge status: Home / Self Care | Payer: Medicare Other | Source: Ambulatory Visit | Attending: Family Medicine | Admitting: Family Medicine

## 2011-09-09 NOTE — Progress Notes (Signed)
Physical Therapy Treatment Patient Details  Name: Sherry Weiss MRN: [id number] Date of Birth: 1946/08/06  Today's Date: 09/09/2011 Time: 1022-1102 Time Calculation (min): 40 min Visit#: 3  of 18   Re-eval: 09/28/11 Charges: Therex x 38'  Subjective: Symptoms/Limitations Symptoms: Pt describes radicular sx in R foot as nagging. Pt reports that she has noticed an improvement in her radicular sx since starting therapy. She also states that she was able to sleep through the nigth last night. Pain Assessment Currently in Pain?: Yes Pain Score:   2 Pain Location: Foot Pain Orientation: Right   Exercise/Treatments Stretches Active Hamstring Stretch: 3 reps;30 seconds Single Knee to Chest Stretch: 3 reps;30 seconds Stability Clam: 10 reps Bridge: 15 reps;3 seconds Bent Knee Raise: 10 reps Ab Set: 10 reps;5 seconds Isometric Hip Flexion: 10 reps;5 seconds Straight Leg Raise: 10 reps Hip Abduction: 10 reps;Side-lying Heel Raises: 15 reps;Limitations Heel Raises Limitations: toe raises x 15 seated  Physical Therapy Assessment and Plan PT Assessment and Plan Clinical Impression Statement: Pt completes exercises without difficulty. Began supine isometric hip flexion to improve core and hip strength. Pt educated on proper technique with getting in and out of bed to protect back. Pt reports no increase in pain at end of session. PT Plan: Continue to progress per PT POC.     Problem List Patient Active Problem List  Diagnoses  . UNSPECIFIED MONONEURITIS OF LOWER LIMB  . Closed fracture of tibial tubercle  . Osteoarthritis, knee  . Orthostatic hypotension  . Type II or unspecified type diabetes mellitus without mention of complication, uncontrolled  . Mixed hyperlipidemia  . Right leg weakness  . History of falling  . Abnormality of gait    PT - End of Session Activity Tolerance: Patient tolerated treatment well General Behavior During Session: [facility] for tasks  performed Cognition: [facility] for tasks performed  [name] 09/09/2011, 12:04 PM

## 2011-09-11 ENCOUNTER — Ambulatory Visit (HOSPITAL_COMMUNITY)
Admission: RE | Admit: 2011-09-11 | Discharge: 2011-09-11 | Disposition: A | Discharge status: Home / Self Care | Payer: Medicare Other | Source: Ambulatory Visit | Attending: Family Medicine | Admitting: Family Medicine

## 2011-09-11 NOTE — Progress Notes (Signed)
Physical Therapy Treatment Patient Details  Name: Sherry Weiss MRN: [id number] Date of Birth: June 21, 1946  Today's Date: 09/11/2011 Time: 1021-1101 Time Calculation (min): 40 min Visit#: 3  of 18   Re-eval: 09/28/11 Charges: Therex x 38'  Subjective: Symptoms/Limitations Symptoms: Pt reports decreased numbness/tingling in feet. Pain Assessment Currently in Pain?: No/denies Pain Score: 0-No pain    Exercise/Treatments Stability Clam: 15 reps Dead Bug: 10 reps Bridge: 15 reps;3 seconds Ab Set: 15 reps Isometric Hip Flexion: 10 reps;5 seconds Straight Leg Raise: 15 reps Hip Abduction: 15 reps;Side-lying Heel Squeeze: 10 reps Leg Raise: 10 reps;Prone Heel Raises: 15 reps;Limitations Heel Raises Limitations: toe raises x 15 seated  Physical Therapy Assessment and Plan PT Assessment and Plan Clinical Impression Statement: Pt requires frequent VC's for proper sequence with dead bug stabilization exercise. Pt continues to have difficulty with standing dorsiflexion.  PT Plan: Continue to progrss per PT POC.     Problem List Patient Active Problem List  Diagnoses  . UNSPECIFIED MONONEURITIS OF LOWER LIMB  . Closed fracture of tibial tubercle  . Osteoarthritis, knee  . Orthostatic hypotension  . Type II or unspecified type diabetes mellitus without mention of complication, uncontrolled  . Mixed hyperlipidemia  . Right leg weakness  . History of falling  . Abnormality of gait    PT - End of Session Activity Tolerance: Patient tolerated treatment well General Behavior During Session: [facility] for tasks performed Cognition: [facility] for tasks performed  [name] 09/11/2011, 11:02 AM

## 2011-09-13 ENCOUNTER — Ambulatory Visit (HOSPITAL_COMMUNITY)
Admission: RE | Admit: 2011-09-13 | Discharge: 2011-09-13 | Disposition: A | Discharge status: Home / Self Care | Payer: Medicare Other | Source: Ambulatory Visit | Attending: Physical Therapy | Admitting: Physical Therapy

## 2011-09-13 NOTE — Progress Notes (Signed)
Physical Therapy Treatment Patient Details  Name: Sherry Weiss MRN: [id number] Date of Birth: 1945/12/28  Today's Date: 09/13/2011 Time: 7829-5621 Time Calculation (min): 43 min Visit#: 4  of 18   Re-eval: 09/27/11    Subjective: Symptoms/Limitations Symptoms: I'm doing better. I'm doing my exercises twice a day Pain Assessment Currently in Pain?: No/denies   Exercise/Treatments      Stretches Active Hamstring Stretch: 3 reps;30 seconds Single Knee to Chest Stretch: 3 reps;30 seconds  Clam: 15 reps Bridge: 15 reps;3 seconds Straight Leg Raise: 10 reps Hip Abduction: Side-lying;15 reps;Weights Hip Abduction Weights (lbs): 3# Single Arm Raise: 10 reps Leg Raise: 10 reps Opposite Arm/Leg Raise: 10 reps Heel Raises: 15 reps;Limitations Heel Raises Limitations: toe raises x 15 seated     Physical Therapy Assessment and Plan PT Assessment and Plan Clinical Impression Statement: Pt needs verbal cuing to complete exercises with proper stabilization but does well once informed. Rehab Potential: Good PT Plan: begin lunging explaining that this would be for vaccum , side lunge for mopping.    Goals  stable ambulation Problem List Patient Active Problem List  Diagnoses  . UNSPECIFIED MONONEURITIS OF LOWER LIMB  . Closed fracture of tibial tubercle  . Osteoarthritis, knee  . Orthostatic hypotension  . Type II or unspecified type diabetes mellitus without mention of complication, uncontrolled  . Mixed hyperlipidemia  . Right leg weakness  . History of falling  . Abnormality of gait    PT - End of Session Activity Tolerance: Patient tolerated treatment well General Behavior During Session: [facility] for tasks performed Cognition: [facility] for tasks performed  RUSSELL,CINDY 09/13/2011, 11:17 AM

## 2011-09-13 NOTE — Patient Instructions (Signed)
updated

## 2011-09-16 ENCOUNTER — Ambulatory Visit (HOSPITAL_COMMUNITY)
Admission: RE | Admit: 2011-09-16 | Discharge: 2011-09-16 | Disposition: A | Discharge status: Home / Self Care | Payer: Medicare Other | Source: Ambulatory Visit | Attending: Family Medicine | Admitting: Family Medicine

## 2011-09-16 DIAGNOSIS — Z9181 History of falling: Secondary | ICD-10-CM

## 2011-09-16 DIAGNOSIS — R29898 Other symptoms and signs involving the musculoskeletal system: Secondary | ICD-10-CM

## 2011-09-16 NOTE — Progress Notes (Signed)
Physical Therapy Treatment Patient Details  Name: NIKIAH GOIN MRN: [id number] Date of Birth: [birth year]  Today's Date: 09/16/2011 Time: 1023-1107 Time Calculation (min): 44 min Visit#: 5  of 18   Re-eval: 09/27/11    Subjective: Symptoms/Limitations Symptoms: Pt states that she has had increased leg pain ever since her last treatment. Pain Assessment Currently in Pain?: Yes Pain Score:   5 Pain Location: Leg Pain Orientation: Right Pain Type: Chronic pain  Exercise/Treatments  Stretches Active Hamstring Stretch: 3 reps;30 seconds Single Knee to Chest Stretch: 3 reps;30 seconds Lumbar Exercises   Stability Dead Bug: 15 reps Bridge: 15 reps Straight Leg Raise: 15 reps Hip Abduction: Side-lying;10 reps;Weights Hip Abduction Weights (lbs): 3# Functional Squats: 10 reps Forward Lunge: 5 reps Side Lunge: 5 reps  Modalities Modalities: Ultrasound Ultrasound Ultrasound Location: R lumbar paraspinal mm Ultrasound Parameters: 1.4 cm2 x 8 min  Ultrasound Goals: Pain  Physical Therapy Assessment and Plan PT Assessment and Plan Clinical Impression Statement: Pt had difficulty with standing exercises but able to complete with good form after manual and verbal cuing.  Pt pain free after ultrasound. Rehab Potential: Good PT Plan: assess how ultrasound did for long term pain relief.    Goals    Problem List Patient Active Problem List  Diagnoses  . UNSPECIFIED MONONEURITIS OF LOWER LIMB  . Closed fracture of tibial tubercle  . Osteoarthritis, knee  . Orthostatic hypotension  . Type II or unspecified type diabetes mellitus without mention of complication, uncontrolled  . Mixed hyperlipidemia  . Right leg weakness  . History of falling  . Abnormality of gait    PT - End of Session Activity Tolerance: Patient tolerated treatment well General Behavior During Session: [facility] for tasks performed Cognition: [facility] for tasks performed  RUSSELL,CINDY 09/16/2011, 1:42  PM

## 2011-09-18 ENCOUNTER — Ambulatory Visit (HOSPITAL_COMMUNITY)
Admission: RE | Admit: 2011-09-18 | Discharge: 2011-09-18 | Disposition: A | Discharge status: Home / Self Care | Payer: Medicare Other | Source: Ambulatory Visit | Attending: Family Medicine | Admitting: Family Medicine

## 2011-09-18 NOTE — Progress Notes (Signed)
Physical Therapy Treatment Patient Details  Name: Sherry Weiss MRN: [id number] Date of Birth: 12/12/1945  Today's Date: 09/18/2011 Time: 4098-1191 Time Calculation (min): 50 min Visit#: 6  of 18   Re-eval: 09/27/11 Charges: Therex x 46'  Subjective: Symptoms/Limitations Symptoms: I'm still having that tight achiness in my right foot. Pain Assessment Currently in Pain?: Yes Pain Score:   3 Pain Location: Foot Pain Orientation: Right   Exercise/Treatments Stretches Active Hamstring Stretch: 3 reps;30 seconds Single Knee to Chest Stretch: 3 reps;30 seconds Stability Dead Bug: 15 reps Bridge: 15 reps Straight Leg Raise: 15 reps Hip Abduction: 15 reps;Weights;Limitations Hip Abduction Limitations: Add x 10 w/o wt Hip Abduction Weights (lbs): 3# Functional Squats: 15 reps Heel Raises: 20 reps Heel Raises Limitations: Toe raises x 15 one at a time standing Forward Lunge: 5 reps Side Lunge: 5 reps  Physical Therapy Assessment and Plan PT Assessment and Plan Clinical Impression Statement: Pt completes mat exercises with minimal difficulty and minimal need for cueing. Pt continues to require multimodal cueing to correctly perform squats and lunges. Pt reports positive results after last [place] but reports that she does not think she needs it today. Pt would like to readdress need for [place] next session. PT Plan: Continue per PT POC. Readdress need for [place] next session.     Problem List Patient Active Problem List  Diagnoses  . UNSPECIFIED MONONEURITIS OF LOWER LIMB  . Closed fracture of tibial tubercle  . Osteoarthritis, knee  . Orthostatic hypotension  . Type II or unspecified type diabetes mellitus without mention of complication, uncontrolled  . Mixed hyperlipidemia  . Right leg weakness  . History of falling  . Abnormality of gait    PT - End of Session Activity Tolerance: Patient tolerated treatment well General Behavior During Session: [facility] for tasks  performed Cognition: [facility] for tasks performed  [name] 09/18/2011, 11:28 AM

## 2011-09-20 ENCOUNTER — Ambulatory Visit (HOSPITAL_COMMUNITY)
Admission: RE | Admit: 2011-09-20 | Discharge: 2011-09-20 | Disposition: A | Discharge status: Home / Self Care | Payer: Medicare Other | Source: Ambulatory Visit | Attending: Family Medicine | Admitting: Family Medicine

## 2011-09-20 DIAGNOSIS — Z9181 History of falling: Secondary | ICD-10-CM

## 2011-09-20 DIAGNOSIS — R29898 Other symptoms and signs involving the musculoskeletal system: Secondary | ICD-10-CM

## 2011-09-20 NOTE — Progress Notes (Signed)
Physical Therapy Treatment Patient Details  Name: Sherry Weiss MRN: [id number] Date of Birth: May 07, 1946  Today's Date: 09/20/2011 Time: 0925-1018 Time Calculation (min): 53 min Visit#: 7  of 18   Re-eval: 09/27/11  Charge: therex 45 min [place] 8 min  Subjective: Symptoms/Limitations Symptoms: Back feels good would like [place] at the end of today's session, felt good last time, R foot pain 3/10. Pain Assessment Currently in Pain?: Yes Pain Score:   3 Pain Location: Foot Pain Orientation: Right  Objective:   Exercise/Treatments Stretches Active Hamstring Stretch: 3 reps;30 seconds;Limitations Active Hamstring Stretch Limitations: with rope Stability Clam: 15 reps;Limitations Clam Limitations: 4# around knee Dead Bug: 15 reps Bridge: 20 reps Straight Leg Raise: 10 reps;Limitations Straight Leg Raises Limitations: 2# Hip Abduction: 15 reps;Weights;Limitations Hip Abduction Limitations: Add x 10 w/o wt Hip Abduction Weights (lbs): 3# Functional Squats: 15 reps Heel Raises: 20 reps Heel Raises Limitations: Toe raises 20 reps seated Forward Lunge: 10 reps Side Lunge: 10 reps  Modalities Modalities: Ultrasound Ultrasound Ultrasound Location: R Lumbar paraspinal mm Ultrasound Parameters: 3 MHz continous 1.4 w/ cm2 x 8 min Ultrasound Goals: Pain  Physical Therapy Assessment and Plan PT Assessment and Plan Clinical Impression Statement: Pt continues to required manual cueing to correctly perform squats and lunges for form/tech.  Strength improving, able to increase reps with some therex and add weight with SLR with visible fatigue but able to complete.  Pt did request [place] at end of session. PT Plan: Reassess at end of next week in 3 more sessions.    Goals    Problem List Patient Active Problem List  Diagnoses  . UNSPECIFIED MONONEURITIS OF LOWER LIMB  . Closed fracture of tibial tubercle  . Osteoarthritis, knee  . Orthostatic hypotension  . Type II or unspecified type  diabetes mellitus without mention of complication, uncontrolled  . Mixed hyperlipidemia  . Right leg weakness  . History of falling  . Abnormality of gait    PT - End of Session Activity Tolerance: Patient tolerated treatment well General Behavior During Session: [facility] for tasks performed Cognition: [facility] for tasks performed  Juel Burrow 09/20/2011, 11:50 AM

## 2011-09-23 ENCOUNTER — Ambulatory Visit (HOSPITAL_COMMUNITY)
Admission: RE | Admit: 2011-09-23 | Discharge: 2011-09-23 | Disposition: A | Discharge status: Home / Self Care | Payer: Medicare Other | Source: Ambulatory Visit | Attending: Family Medicine | Admitting: Family Medicine

## 2011-09-23 NOTE — Progress Notes (Signed)
Physical Therapy Treatment Patient Details  Name: Sherry Weiss MRN: [id number] Date of Birth: Aug 24, 1946  Today's Date: 09/23/2011 Time: 1018-1100 Time Calculation (min): 42 min Visit#: 8  of 18   Re-eval: 09/27/11 Charges: Therex x 39'  Subjective: Symptoms/Limitations Symptoms: I've been sore all weekend from last session. I could hardly sit down. Pain Assessment Currently in Pain?: Yes Pain Score:   5 Pain Location: Foot Pain Orientation: Right   Exercise/Treatments  Stretches Active Hamstring Stretch: 3 reps;30 seconds;Limitations Active Hamstring Stretch Limitations: with rope Stability Clam: 15 reps;Limitations Clam Limitations: 4# around knee Dead Bug: 15 reps Bridge: 20 reps Straight Leg Raise: 10 reps;Limitations Straight Leg Raises Limitations: 2# Hip Abduction: 15 reps;Weights;Limitations Hip Abduction Limitations: Add x 10 w/o wt Hip Abduction Weights (lbs): 3# Functional Squats: 15 reps Heel Raises: 20 reps Heel Raises Limitations: Toe raises 20 reps standing one at a time Forward Lunge: 10 reps Side Lunge: 10 reps  Physical Therapy Assessment and Plan PT Assessment and Plan Clinical Impression Statement: Pt compeltes theres with good form. Pt requires decreased cueing for proper technique. No increase in therex this session secondary to soreness after last session. [place] not completed this session secondary to no back discomfort. Pt reports no increase in pain at end of session.  PT Plan: Continue x 2 more sesisons. Reassess at end of this week.     Problem List Patient Active Problem List  Diagnoses  . UNSPECIFIED MONONEURITIS OF LOWER LIMB  . Closed fracture of tibial tubercle  . Osteoarthritis, knee  . Orthostatic hypotension  . Type II or unspecified type diabetes mellitus without mention of complication, uncontrolled  . Mixed hyperlipidemia  . Right leg weakness  . History of falling  . Abnormality of gait    PT - End of Session Activity  Tolerance: Patient tolerated treatment well General Behavior During Session: [facility] for tasks performed Cognition: [facility] for tasks performed  [name] 09/23/2011, 11:32 AM

## 2011-09-25 ENCOUNTER — Ambulatory Visit (HOSPITAL_COMMUNITY)
Admission: RE | Admit: 2011-09-25 | Discharge: 2011-09-25 | Disposition: A | Discharge status: Home / Self Care | Payer: Medicare Other | Source: Ambulatory Visit | Attending: Family Medicine | Admitting: Family Medicine

## 2011-09-25 NOTE — Progress Notes (Signed)
Physical Therapy Treatment Patient Details  Name: Sherry Weiss MRN: [id number] Date of Birth: [birth year]  Today's Date: 09/25/2011 Time: 4098-1191 Time Calculation (min): 49 min Visit#: 9  of 18   Re-eval: 09/27/11 Charges: Therex x 44'  Subjective: Symptoms/Limitations Symptoms: "I'm feeling better today. No pain" Pain Assessment Currently in Pain?: No/denies Pain Score: 0-No pain   Exercise/Treatments Stretches Active Hamstring Stretch: 3 reps;30 seconds;Limitations Active Hamstring Stretch Limitations: with rope Stability Clam: 15 reps;Limitations;Side-lying Clam Limitations: 4# around knee Straight Leg Raise: 10 reps;Limitations Straight Leg Raises Limitations: 3# Hip Abduction: 15 reps;Weights;Limitations Hip Abduction Limitations: Add x 15 w/o wt Hip Abduction Weights (lbs): 3# Heel Squeeze: 10 reps Functional Squats: 15 reps Heel Raises: 20 reps Heel Raises Limitations: Toe raises 20 reps standing one at a time Forward Lunge: 10 reps Side Lunge: 10 reps Machine Exercises Tread Mill: 3' @ 1.0  Physical Therapy Assessment and Plan PT Assessment and Plan Clinical Impression Statement: Pt completes all therex well with minimal need for cueing. Pt displays increased tolerance for exercises. Began walking on TM to improve strength and endurance. Pt reprots no increase in pain at end of session. Advanced HEP given. PT Plan: Reassess next session.     Problem List Patient Active Problem List  Diagnoses  . UNSPECIFIED MONONEURITIS OF LOWER LIMB  . Closed fracture of tibial tubercle  . Osteoarthritis, knee  . Orthostatic hypotension  . Type II or unspecified type diabetes mellitus without mention of complication, uncontrolled  . Mixed hyperlipidemia  . Right leg weakness  . History of falling  . Abnormality of gait    PT - End of Session Activity Tolerance: Patient tolerated treatment well General Behavior During Session: [facility] for tasks  performed Cognition: [facility] for tasks performed  Sherry Weiss 09/25/2011, 10:56 AM

## 2011-09-27 ENCOUNTER — Ambulatory Visit (HOSPITAL_COMMUNITY)
Admission: RE | Admit: 2011-09-27 | Discharge: 2011-09-27 | Disposition: A | Discharge status: Home / Self Care | Payer: Medicare Other | Source: Ambulatory Visit | Attending: Physical Therapy | Admitting: Physical Therapy

## 2011-09-27 DIAGNOSIS — R29898 Other symptoms and signs involving the musculoskeletal system: Secondary | ICD-10-CM

## 2011-09-27 DIAGNOSIS — Z9181 History of falling: Secondary | ICD-10-CM

## 2011-09-27 NOTE — Patient Instructions (Addendum)
HEP

## 2011-09-27 NOTE — Evaluation (Signed)
Physical Therapy Evaluation  Patient Details  Name: Sherry Weiss MRN: [id number] Date of Birth: 23-May-1946  Today's Date: 09/27/2011 Time: 0900-0950 Time Calculation (min): 50 min  Visit#: 10  of 22   Re-eval: 10/27/11    Past Medical History:  Past Medical History  Diagnosis Date  . Type 2 diabetes mellitus   . Hyperlipidemia   . Osteoarthritis, knee    Past Surgical History:  Past Surgical History  Procedure Date  . Cesarean section     Subjective Symptoms/Limitations Symptoms: The patient states that her back is doing alright but she is having some pain in her knees but her MD has stated before that she might need TKR in both knees. How long can you sit comfortably?: The patient states that she is able to sit for an hour and half comfortably now she was sitting for an hour. How long can you stand comfortably?: The patient states that she is able to stand as long as she wants. How long can you walk comfortably?: The patient is still walking with her cane she is able to walk for an hour and then she needs to rest due to her legs being sore. Pain Assessment Currently in Pain?: No/denies Pain Score: 0-No pain Pain Location: Back  Precautions/Restrictions   falls  Prior Functioning   Pt falling at home Cognition/Observation  Pt gait is less antalgic and pt has faster velocity.   Assessment RLE Strength Right Hip Flexion: 5/5 was 3/5 Right Hip Extension: 3+/5 was 2/5 Right Hip ABduction: 4/5 was 3- Right Hip ADduction: 4/5 was 3/5 Right Knee Flexion: 3+/5 was 2+ Right Knee Extension: 5/5 was 3+ Right Ankle Dorsiflexion: 3/5 was 3/5 Right Ankle Plantar Flexion: 3+/5 was 3+/5 Right Ankle Inversion: 2/5 Right Ankle Eversion: 2/5 LLE Strength Left Hip Flexion: 5/5 was 5/5 Left Hip Extension: 5/5 was 5/5 Left Hip ABduction: 5/5 was 5/5 Left Hip ADduction: 5/5 was 4/5 Left Knee Flexion: 5/5 was 4+ Left Knee Extension: 5/5 was 3+ Left Ankle Dorsiflexion:  3/5 Left Ankle Plantar Flexion: 3+/5 Left Ankle Inversion: 3/5 was 5/5 Left Ankle Eversion: 3/5 was 5/5  Exercise/Treatments Mobility/Balance  Ambulation/Gait Gait velocity:  (gait velocity is quicker. ) Berg Balance Test Sit to Stand: Able to stand without using hands and stabilize independently Standing Unsupported: Able to stand safely 2 minutes Sitting with Back Unsupported but Feet Supported on Floor or Stool: Able to sit safely and securely 2 minutes Stand to Sit: Sits safely with minimal use of hands Transfers: Able to transfer safely, minor use of hands Standing Unsupported with Eyes Closed: Able to stand 10 seconds safely Standing Ubsupported with Feet Together: Needs help to attain position but able to stand for 30 seconds with feet together From Standing, Reach Forward with Outstretched Arm: Can reach forward >12 cm safely (5") From Standing Position, Pick up Object from Floor: Able to pick up shoe safely and easily From Standing Position, Turn to Look Behind Over each Shoulder: Looks behind one side only/other side shows less weight shift Turn 360 Degrees: Able to turn 360 degrees safely but slowly Standing Unsupported, Alternately Place Feet on Step/Stool: Able to stand independently and complete 8 steps >20 seconds Standing Unsupported, One Foot in Front: Able to take small step independently and hold 30 seconds Standing on One Leg: Tries to lift leg/unable to hold 3 seconds but remains standing independently Total Score: 43  Timed Up and Go Test TUG: Normal TUG Normal TUG (seconds): 19  Physical Therapy Assessment and Plan PT Assessment and Plan Clinical Impression Statement: Pt has improved in hip and knee mm strength but still has deficits in ankle and toe mm as well as balance.  Pt will benefit from skilled PT  to improve strength and balance.   Rehab Potential: Good PT Frequency: Min 3X/week PT Duration: 4 weeks PT Treatment/Interventions: Balance  training;Therapeutic activities;Therapeutic exercise;Neuromuscular re-education PT Plan: begin baps, towel crunch, towel pull to and away, ankle isometric and t-band ex B; begin balance activity tandem stance, SLS, tandem walking  heel raises...Marland KitchenMarland KitchenMarland Kitchen    Goals Home Exercise Program Pt will Perform Home Exercise Program: Independently PT Short Term Goals PT Short Term Goal 1 - Progress: Met PT Short Term Goal 2 - Progress: Met PT Long Term Goals PT Long Term Goal 1 - Progress: Met PT Long Term Goal 2 - Progress: Partly met Long Term Goal 3: except ankle and toe mm Long Term Goal 3 Progress: Met Long Term Goal 4 Progress: Met Long Term Goal 5 Progress: Not met Additional PT Long Term Goals?: Yes PT Long Term Goal 6: new goal as of 09/27/11  Pt to be able to walk without an assistive device. PT Long Term Goal 7: increase Berg score by 5 points in four weeks.  new goal as of 09/27/11 PT Long Term Goal 8: new goal as of 09/27/11- decrease TUG score by 5 seconds. PT Long Term Goal 9: new goal oas of 1/25 13- increase ankle and toe strength by one level 3 wks  Problem List Patient Active Problem List  Diagnoses  . UNSPECIFIED MONONEURITIS OF LOWER LIMB  . Closed fracture of tibial tubercle  . Osteoarthritis, knee  . Orthostatic hypotension  . Type II or unspecified type diabetes mellitus without mention of complication, uncontrolled  . Mixed hyperlipidemia  . Right leg weakness  . History of falling  . Abnormality of gait    PT - End of Session Activity Tolerance: Patient tolerated treatment well General Behavior During Session: [facility] for tasks performed Cognition: [facility] for tasks performed PT Plan of Care PT Home Exercise Plan: given toe curl ex, T-band for AT Consulted and Agree with Plan of Care: Patient   RUSSELL,CINDY 09/27/2011, 11:30 AM  Physician Documentation Your signature is required to indicate approval of the treatment plan as stated above.  Please sign and either send  electronically or make a copy of this report for your files and return this physician signed original.   Please mark one 1.__approve of plan  2. ___approve of plan with the following conditions.   ______________________________                                                          _____________________ Physician Signature  Date  

## 2011-09-30 ENCOUNTER — Ambulatory Visit (HOSPITAL_COMMUNITY)
Admission: RE | Admit: 2011-09-30 | Discharge: 2011-09-30 | Disposition: A | Discharge status: Home / Self Care | Payer: Medicare Other | Source: Ambulatory Visit | Attending: Family Medicine | Admitting: Family Medicine

## 2011-09-30 NOTE — Progress Notes (Signed)
Physical Therapy Treatment Patient Details  Name: Sherry Weiss MRN: [id number] Date of Birth: November 22, 1945  Today's Date: 09/30/2011 Time: 1012-1104 Time Calculation (min): 52 min Visit#: 11  of 22   Re-eval: 10/25/11 Charges:  therex 48'    Subjective: Symptoms/Limitations Symptoms: Pt. reports no pain today.  Focus has changed to ankle strengthening and balance; pt. is independent with HEP for LB and LE's Pain Assessment Currently in Pain?: No/denies   Exercise/Treatments Ankle Exercises Ankle Dorsiflexion isometric Ankle Plantar Flexion: isometric Ankle Eversion: isometric Ankle Inversion : isometric Heel Raises: 20 reps  Toe raises 20 reps standing one at a time Tandem gait 1RT  Towel Inversion/Eversion: 2 reps bilateral seated BAPS: Level 1;5 reps bilateral seated SLS: R/L: 10" max Tandem Stance L lead: 30", R lead: 12"     Physical Therapy Assessment and Plan PT Assessment and Plan Clinical Impression Statement: Added new therex to focus on ankle strength and stability.  Pt with extreme difficulty with towel/BAPS requiring AA with Dorsiflexion to complete task.  Required max cues with all activities. PT Plan: Continue POC; add NMR for B anterior tib muscles next visit per evaluating PT.     Problem List Patient Active Problem List  Diagnoses  . UNSPECIFIED MONONEURITIS OF LOWER LIMB  . Closed fracture of tibial tubercle  . Osteoarthritis, knee  . Orthostatic hypotension  . Type II or unspecified type diabetes mellitus without mention of complication, uncontrolled  . Mixed hyperlipidemia  . Right leg weakness  . History of falling  . Abnormality of gait    PT - End of Session Equipment Utilized During Treatment: Gait belt Activity Tolerance: Patient tolerated treatment well General Behavior During Session: [facility] for tasks performed Cognition: [facility] for tasks performed  Amy B. Bascom Levels, PTA 09/30/2011, 12:27 PM

## 2011-10-02 ENCOUNTER — Ambulatory Visit (HOSPITAL_COMMUNITY)
Admission: RE | Admit: 2011-10-02 | Discharge: 2011-10-02 | Disposition: A | Discharge status: Home / Self Care | Payer: Medicare Other | Source: Ambulatory Visit | Attending: Family Medicine | Admitting: Family Medicine

## 2011-10-02 NOTE — Progress Notes (Signed)
Physical Therapy Treatment Patient Details  Name: Sherry Weiss MRN: [id number] Date of Birth: 03-26-1946  Today's Date: 10/02/2011 Time: 4098-1191 Time Calculation (min): 40 min Visit#: 12  of 22   Re-eval: 10/25/11 Charges: Therex x 38'  Subjective: Symptoms/Limitations Symptoms: My feet just feel sore. Pain Assessment Currently in Pain?: Yes Pain Score:   2 Pain Location: Foot Pain Orientation: Right;Left   Exercise/Treatments Stability Functional Squats: 15 reps Heel Raises: 20 reps Heel Raises Limitations: Toe raises 20 reps standing one at a time Forward Lunge: 15 reps Side Lunge: 15 reps Ankle Exercises Heel Raises: 20 reps Heel Raises Limitations: Toe raises 20 reps standing one at a time Toe Raise Limitations: Tandem gait 2RT  BAPS: Level 1;10 reps SLS: R/L 3" max Rebounder: Tandem stance 30" B HHA as needed   Physical Therapy Assessment and Plan PT Assessment and Plan Clinical Impression Statement: Pt continues to have extreme difficulty with BAPS board requiring multimodal cueing to complete correctly. Pt completes lunges and squats with good form and these exercises may be D/C'd to HEP. Pt reports no increase in pain at end of session. PT Plan: Continue to progress per PT POC. Begin wall bumps next session to improve proprioceptive control.     Problem List Patient Active Problem List  Diagnoses  . UNSPECIFIED MONONEURITIS OF LOWER LIMB  . Closed fracture of tibial tubercle  . Osteoarthritis, knee  . Orthostatic hypotension  . Type II or unspecified type diabetes mellitus without mention of complication, uncontrolled  . Mixed hyperlipidemia  . Right leg weakness  . History of falling  . Abnormality of gait    PT - End of Session Activity Tolerance: Patient tolerated treatment well General Behavior During Session: [facility] for tasks performed Cognition: [facility] for tasks performed  [name] 10/02/2011, 11:50 AM

## 2011-10-04 ENCOUNTER — Ambulatory Visit (HOSPITAL_COMMUNITY)
Admission: RE | Admit: 2011-10-04 | Discharge: 2011-10-04 | Disposition: A | Discharge status: Home / Self Care | Payer: Medicare Other | Source: Ambulatory Visit | Attending: Orthopedic Surgery | Admitting: Orthopedic Surgery

## 2011-10-04 DIAGNOSIS — IMO0001 Reserved for inherently not codable concepts without codable children: Secondary | ICD-10-CM | POA: Insufficient documentation

## 2011-10-04 DIAGNOSIS — M25569 Pain in unspecified knee: Secondary | ICD-10-CM | POA: Insufficient documentation

## 2011-10-04 DIAGNOSIS — E119 Type 2 diabetes mellitus without complications: Secondary | ICD-10-CM | POA: Insufficient documentation

## 2011-10-04 DIAGNOSIS — R262 Difficulty in walking, not elsewhere classified: Secondary | ICD-10-CM | POA: Insufficient documentation

## 2011-10-04 DIAGNOSIS — M6281 Muscle weakness (generalized): Secondary | ICD-10-CM | POA: Insufficient documentation

## 2011-10-04 NOTE — Progress Notes (Signed)
Physical Therapy Treatment Patient Details  Name: Sherry Weiss MRN: [id number] Date of Birth: 1946/05/26  Today's Date: 10/04/2011 Time: 1525-1610 Time Calculation (min): 45 min Visit#: 13  of 22   Re-eval: 10/25/11  there ex 34; e-stim 15'  Subjective: Symptoms/Limitations Symptoms: I'm still sore   Exercise/Treatments     Ankle Exercises Ankle Dorsiflexion: Strengthening;10 reps Ankle Dorsiflexion Limitations: isometric Ankle Plantar Flexion: Strengthening;10 reps;Both Ankle Plantar Flexion Limitations: isometric Ankle Eversion: Strengthening;Both;10 reps Ankle Eversion Limitations: isometric Ankle Inversion: Strengthening;Both;10 reps Ankle Inversion Limitations: isometric  ABC's: Limitations ABC's Limitations: marble pick up B R lg; L lg and small  Towel Crunch: Limitations Towel Crunch Limitations: PROM R LE Towel Inversion/Eversion: 2 reps    Modalities Modalities: Archivist Stimulation Location: R lateral aspect of leg Electrical Stimulation Action: Research officer, political party Stimulation Parameters: Russian stim 100bbp; 52 Ma cc; x 15' Electrical Stimulation Goals: Strength  Physical Therapy Assessment and Plan PT Assessment and Plan Clinical Impression Statement: added marble pick up and e-stim for strengthening. PT Frequency: Min 3X/week PT Plan: continue to see pt.  Continue with Baps next treatment.    Goals    Problem List Patient Active Problem List  Diagnoses  . UNSPECIFIED MONONEURITIS OF LOWER LIMB  . Closed fracture of tibial tubercle  . Osteoarthritis, knee  . Orthostatic hypotension  . Type II or unspecified type diabetes mellitus without mention of complication, uncontrolled  . Mixed hyperlipidemia  . Right leg weakness  . History of falling  . Abnormality of gait    PT - End of Session Activity Tolerance: Patient tolerated treatment well General Behavior During Session: [facility] for tasks  performed Cognition: [facility] for tasks performed  RUSSELL,CINDY 10/04/2011, 4:02 PM

## 2011-10-07 ENCOUNTER — Ambulatory Visit (HOSPITAL_COMMUNITY)
Admission: RE | Admit: 2011-10-07 | Discharge: 2011-10-07 | Disposition: A | Discharge status: Home / Self Care | Payer: Medicare Other | Source: Ambulatory Visit | Attending: Family Medicine | Admitting: Family Medicine

## 2011-10-07 DIAGNOSIS — R29898 Other symptoms and signs involving the musculoskeletal system: Secondary | ICD-10-CM

## 2011-10-07 DIAGNOSIS — Z9181 History of falling: Secondary | ICD-10-CM

## 2011-10-07 NOTE — Progress Notes (Signed)
Physical Therapy Treatment Patient Details  Name: Sherry Weiss MRN: [id number] Date of Birth: February 29, 1946  Today's Date: 10/07/2011 Time: 4098-1191 Time Calculation (min): 59 min Visit#: 14  of 22   Re-eval: 10/25/11  there ex 39; estim 11  Subjective: Symptoms/Limitations Symptoms: Pt states soreness is decreasing Pain Assessment Currently in Pain?: No/denies  Precautions/Restrictions     Exercise/Treatments Mobility/Balance        Ankle Exercises Ankle Dorsiflexion: Strengthening;10 reps Ankle Dorsiflexion Limitations: isometric Ankle Plantar Flexion: Strengthening;10 reps;Both Ankle Plantar Flexion Limitations: isometric Ankle Eversion: Strengthening;Both;10 reps Ankle Eversion Limitations: isometric Ankle Inversion: Strengthening;Both;10 reps Ankle Inversion Limitations: isometric  ABC's: Limitations ABC's Limitations: marble pick up B R lg; L lg and small  Towel Crunch: Limitations Towel Crunch Limitations: PROM R LE Towel Inversion/Eversion: 2 reps BAPS: Sitting;Level 1;10 reps  Plyometrics    Modalities Modalities: Copywriter, advertising Location: russian stim. to increase strength. Electrical Stimulation Parameters: 1000bbp; 52 MA cc; x 11'  Physical Therapy Assessment and Plan PT Assessment and Plan Clinical Impression Statement: Pt doing better with marble pick up Rehab Potential: Good PT Plan: continue with pt for ankle strengthening    Goals    Problem List Patient Active Problem List  Diagnoses  . UNSPECIFIED MONONEURITIS OF LOWER LIMB  . Closed fracture of tibial tubercle  . Osteoarthritis, knee  . Orthostatic hypotension  . Type II or unspecified type diabetes mellitus without mention of complication, uncontrolled  . Mixed hyperlipidemia  . Right leg weakness  . History of falling  . Abnormality of gait    PT - End of Session Activity Tolerance: Patient tolerated treatment  well General Behavior During Session: [facility] for tasks performed Cognition: [facility] for tasks performed  Brysen Shankman,CINDY 10/07/2011, 11:06 AM

## 2011-10-09 ENCOUNTER — Ambulatory Visit (HOSPITAL_COMMUNITY)
Admission: RE | Admit: 2011-10-09 | Discharge: 2011-10-09 | Disposition: A | Discharge status: Home / Self Care | Payer: Medicare Other | Source: Ambulatory Visit | Attending: Family Medicine | Admitting: Family Medicine

## 2011-10-09 NOTE — Progress Notes (Signed)
Physical Therapy Treatment Patient Details  Name: Sherry Weiss MRN: [id number] Date of Birth: March 24, 1946  Today's Date: 10/09/2011 Time: 1012-1115 Time Calculation (min): 63 min Visit#: 15  of 22   Re-eval: 10/25/11 Charges:  therex 46' , estim X 1 unit    Subjective: Symptoms/Limitations Symptoms: Pt. reports she was unable to work on her exercises yesterday due to helping her husband put a floor in.  Reports nagging soreness but no pain Pain Assessment Currently in Pain?: No/denies   Exercise/Treatments Ankle Exercises Ankle Dorsiflexion: Strengthening;10 reps Ankle Dorsiflexion Limitations: isometric Ankle Plantar Flexion: Strengthening;10 reps Ankle Plantar Flexion Limitations: isometric Ankle Eversion: Strengthening;10 reps Ankle Eversion Limitations: isometric Ankle Inversion: Strengthening;10 reps Ankle Inversion Limitations: isometric Heel Raises: 20 reps;Limitations Heel Raises Limitations: Toe raises 20 reps seated  ABC's: Limitations ABC's Limitations: marble pick up B 1X each  Towel Crunch: Limitations Towel Crunch Limitations: PROM R LE Towel Inversion/Eversion: 2 reps BAPS: Sitting;Level 1;5 reps    Modalities Modalities: Copywriter, advertising Location: Russian stim to increase strength to R Anterior Tib  Electrical Stimulation Parameters: 50% duty cycle, ramp 2 sec, cycle 10/20, current 40 mA CC, treatment time 10 minutes Electrical Stimulation Goals: Strength  Physical Therapy Assessment and Plan PT Assessment and Plan Clinical Impression Statement: Pt. continues to require AA to perform exercises correctly and because of weakness.  R ankle continues to be weaker than L; BAPS is most difficult for patient. PT Plan: Continue to increase strength per POC.     Problem List Patient Active Problem List  Diagnoses  . UNSPECIFIED MONONEURITIS OF LOWER LIMB  . Closed fracture of tibial tubercle  .  Osteoarthritis, knee  . Orthostatic hypotension  . Type II or unspecified type diabetes mellitus without mention of complication, uncontrolled  . Mixed hyperlipidemia  . Right leg weakness  . History of falling  . Abnormality of gait    PT - End of Session Activity Tolerance: Patient tolerated treatment well General Behavior During Session: [facility] for tasks performed Cognition: [facility] for tasks performed  Mackie Goon B. Bascom Levels, PTA 10/09/2011, 11:06 AM

## 2011-10-11 ENCOUNTER — Ambulatory Visit (HOSPITAL_COMMUNITY)
Admission: RE | Admit: 2011-10-11 | Discharge: 2011-10-11 | Disposition: A | Discharge status: Home / Self Care | Payer: Medicare Other | Source: Ambulatory Visit | Attending: Family Medicine | Admitting: Family Medicine

## 2011-10-11 DIAGNOSIS — Z9181 History of falling: Secondary | ICD-10-CM

## 2011-10-11 DIAGNOSIS — R29898 Other symptoms and signs involving the musculoskeletal system: Secondary | ICD-10-CM

## 2011-10-11 NOTE — Progress Notes (Signed)
Physical Therapy Treatment Patient Details  Name: Sherry Weiss MRN: [id number] Date of Birth: 14-Dec-1945  Today's Date: 10/11/2011 Time: 0806-0910 Time Calculation (min): 64 min Visit#: 16  of 22   Re-eval: 10/25/11    Subjective: Symptoms/Limitations Symptoms: Pt states she slipped on the step and twisted her right knee.  Pain on the back side of her knee Pain Assessment Currently in Pain?: No/denies (took pain meds)  Precautions/Restrictions     Exercise/Treatments    Ankle Exercises - Standing BAPS: Sitting;Level 1;5 reps SLS:  (B x 2 one hand held x 12 sec.) Other Standing Ankle Exercises: tandem stance x 2 b Ankle Exercises - Seated ABC's Limitations: at home Towel Crunch: 1 rep;Limitations Towel Crunch Limitations: attempt for one minute Ankle Exercises - Supine Isometrics: x10 B Other Supine Ankle Exercises: PROM Ankle Exercises - Sidelying   Modalities Modalities: Archivist Stimulation Location: Russian stim to increace strength. Electrical Stimulation Parameters: 50% ;10/10 current at 50 mA cc rx trime 50# Electrical Stimulation Goals: Strength  Physical Therapy Assessment and Plan PT Assessment and Plan Clinical Impression Statement: continues to have significant weakness pt may benefit from custom made AFO;  balance activities are difficult for pateint. PT Plan: Continue POC    Goals    Problem List Patient Active Problem List  Diagnoses  . UNSPECIFIED MONONEURITIS OF LOWER LIMB  . Closed fracture of tibial tubercle  . Osteoarthritis, knee  . Orthostatic hypotension  . Type II or unspecified type diabetes mellitus without mention of complication, uncontrolled  . Mixed hyperlipidemia  . Right leg weakness  . History of falling  . Abnormality of gait    PT - End of Session Activity Tolerance: Patient tolerated treatment well General Behavior During Session: [facility] for tasks  performed  Bernese Doffing,CINDY 10/11/2011, 8:57 AM

## 2011-10-14 ENCOUNTER — Ambulatory Visit (HOSPITAL_COMMUNITY)
Admission: RE | Admit: 2011-10-14 | Discharge: 2011-10-14 | Disposition: A | Discharge status: Home / Self Care | Payer: Medicare Other | Source: Ambulatory Visit | Attending: Family Medicine | Admitting: Family Medicine

## 2011-10-14 DIAGNOSIS — Z9181 History of falling: Secondary | ICD-10-CM

## 2011-10-14 DIAGNOSIS — R29898 Other symptoms and signs involving the musculoskeletal system: Secondary | ICD-10-CM

## 2011-10-14 NOTE — Progress Notes (Signed)
Physical Therapy Treatment Patient Details  Name: Sherry Weiss MRN: [id number] Date of Birth: Sep 22, 1945  Today's Date: 10/14/2011 Time: 8295-6213 Time Calculation (min): 48 min Visit#: 17  of 22   Re-eval: 10/25/11    Subjective: Symptoms/Limitations Symptoms: Pt states she has been using her marbles at home. Pain Assessment Currently in Pain?: No/denies  Precautions/Restrictions     Exercise/Treatments      Ankle Exercises - Standing SLS:  (B x 2 one hand held x 12 sec.) Other Standing Ankle Exercises: tandem stance x 2 b Ankle Exercises - Seated ABC's Limitations: at home Towel Inversion/Eversion: 3 reps;Limitations Towel Inversion/Eversion Limitations: B BAPS: Sitting;Level 1;5 reps;Limitations BAPS Limitations: B Ankle Exercises - Supine Isometrics: x10 B Other Supine Ankle Exercises: PROM Ankle Exercises - Sidelying Ankle Inversion: AROM;Both;10 reps Modalities Modalities: Copywriter, advertising Location: R evertors. Electrical Stimulation Action: Russian stim to Games developer Parameters: 10/10 ; 100 pps; cc for 15 min. Electrical Stimulation Goals: Strength  Physical Therapy Assessment and Plan PT Assessment and Plan Clinical Impression Statement: Pt continues to work at home minimal strengthening noted will contact MD re AF) Rehab Potential: Fair PT Frequency: Min 3X/week PT Treatment/Interventions: Therapeutic exercise;Balance training;Other (comment) (e-stim for strengthening) PT Plan: Continue to work on balance begin tandem gait next treatment.    Goals    Problem List Patient Active Problem List  Diagnoses  . UNSPECIFIED MONONEURITIS OF LOWER LIMB  . Closed fracture of tibial tubercle  . Osteoarthritis, knee  . Orthostatic hypotension  . Type II or unspecified type diabetes mellitus without mention of complication, uncontrolled  . Mixed hyperlipidemia  . Right leg  weakness  . History of falling  . Abnormality of gait    PT - End of Session Activity Tolerance: Patient tolerated treatment well General Behavior During Session: [facility] for tasks performed Cognition: [facility] for tasks performed  RUSSELL,CINDY 10/14/2011, 12:06 PM

## 2011-10-16 ENCOUNTER — Ambulatory Visit (HOSPITAL_COMMUNITY)
Admission: RE | Admit: 2011-10-16 | Discharge: 2011-10-16 | Disposition: A | Discharge status: Home / Self Care | Payer: Medicare Other | Source: Ambulatory Visit | Attending: Orthopedic Surgery | Admitting: Orthopedic Surgery

## 2011-10-16 NOTE — Progress Notes (Signed)
Physical Therapy Treatment Patient Details  Name: Sherry Weiss MRN: [id number] Date of Birth: 1946/03/30  Today's Date: 10/16/2011 Time: 1108-1210 Time Calculation (min): 62 min Visit#: 18  of 22   Re-eval: 10/25/11 Charges:  therex 40',  estim unattended X 1 unit    Subjective: Symptoms/Limitations Symptoms: Pt. reports compliance with HEP., Pt. questioning need for orthotic (AFO) for R LE. Pain Assessment Currently in Pain?: No/denies   Exercise/Treatments Ankle Exercises - Standing BAPS: Sitting;Level 1;5 reps SLS: max R: 33", L: 10' Other Standing Ankle Exercises: tandem stance 2X30" each Ankle Exercises - Seated Towel Crunch: Limitations Towel Crunch Limitations: attempt for one minute Towel Inversion/Eversion: Limitations Towel Inversion/Eversion Limitations: B 1 minute each BAPS: Sitting;Level 1;5 reps;Limitations BAPS Limitations: B Ankle Exercises - Supine Isometrics: x10 B Other Supine Ankle Exercises: PROM Ankle Exercises - Sidelying    Modalities Modalities: Archivist Stimulation Location: R anterior tib/eversion Electrical Stimulation Action: Russian Stim, duty cycle 10/20, 59 Ma for 10 minutes  Physical Therapy Assessment and Plan PT Assessment and Plan Clinical Impression Statement: Pt. compliant with HEP, minimal increase in R DF/eversion strength.  Continued difficulty with BAPS negotiation Bilaterally. PT Plan: Re-evaluate next visit.     Problem List Patient Active Problem List  Diagnoses  . UNSPECIFIED MONONEURITIS OF LOWER LIMB  . Closed fracture of tibial tubercle  . Osteoarthritis, knee  . Orthostatic hypotension  . Type II or unspecified type diabetes mellitus without mention of complication, uncontrolled  . Mixed hyperlipidemia  . Right leg weakness  . History of falling  . Abnormality of gait    PT - End of Session Activity Tolerance: Patient tolerated treatment  well General Behavior During Session: [facility] for tasks performed Cognition: [facility] for tasks performed  Sherry Weiss B. Bascom Levels, PTA 10/16/2011, 12:18 PM

## 2011-10-17 ENCOUNTER — Ambulatory Visit (HOSPITAL_COMMUNITY): Payer: Medicare Other | Admitting: Physical Therapy

## 2011-10-22 ENCOUNTER — Ambulatory Visit (HOSPITAL_COMMUNITY)
Admission: RE | Admit: 2011-10-22 | Discharge: 2011-10-22 | Disposition: A | Discharge status: Home / Self Care | Payer: Medicare Other | Source: Ambulatory Visit | Attending: Family Medicine | Admitting: Family Medicine

## 2011-10-22 DIAGNOSIS — R29898 Other symptoms and signs involving the musculoskeletal system: Secondary | ICD-10-CM

## 2011-10-22 DIAGNOSIS — Z9181 History of falling: Secondary | ICD-10-CM

## 2011-10-22 NOTE — Evaluation (Signed)
Physical Therapy Reassessment. Patient Details  Name: Sherry Weiss MRN: [id number] Date of Birth: 1946/06/04  Today's Date: 10/22/2011 Time: 1108-1200 Time Calculation (min): 52 min  Visit#: 19  of 20   Re-eval:    today  Past Medical History:  Past Medical History  Diagnosis Date  . Type 2 diabetes mellitus   . Hyperlipidemia   . Osteoarthritis, knee    Past Surgical History:  Past Surgical History  Procedure Date  . Cesarean section     Subjective Symptoms/Limitations Symptoms: Pt continues to complete HEP but states she still feels like she is going to fall.    How long can you sit comfortably?: Pt has not difficulty sitting. How long can you stand comfortably?: Pt states she is able to stand as long as she wants. How long can you walk comfortably?: The patient is walking in her come without an assistive device walking outside with her quad cane. Pain Assessment Currently in Pain?: No/denies Pain Score: 0-No pain   Assessment RLE Strength Right Hip Flexion: 5/5 Right Hip Extension: 4/5 (was 3+/5) Right Hip ABduction:  (4+/5 was 4/5) Right Hip ADduction: 5/5 (was 4/5) Right Knee Flexion:  (4-/5 was 3+/5) Right Knee Extension: 5/5 Right Ankle Dorsiflexion: 3-/5 Right Ankle Plantar Flexion: 3+/5 Right Ankle Inversion: 3-/5 (was 2/5) Right Ankle Eversion: 2+/5 (was 2/5) LLE Strength Left Hip Flexion: 5/5 Left Hip Extension: 5/5 Left Hip ABduction: 5/5 Left Hip ADduction: 5/5 Left Knee Flexion: 5/5 Left Knee Extension: 5/5 Left Ankle Dorsiflexion: 3+/5 (was 3/5.) Left Ankle Plantar Flexion: 3+/5 Left Ankle Inversion: 3+/5 (was 3/5) Left Ankle Eversion: 3/5  Berg balance test:  46/54 was 43/54 TUG test:  19 seconds was 19 seconds.  Anything over 14 seconds shows an increased risk to falling. Exercise/Treatments    Ankle Exercises - Standing SLS:  (x 3 @ max 8") Toe Raise Limitations:  (Tandem gt x 2 B)   Ankle Exercises - Sidelying Ankle Inversion:  Strengthening;Both;10 reps Ankle Eversion: Both;10 reps   Physical Therapy Assessment and Plan PT Assessment and Plan Clinical Impression Statement: Pt continues to show gains in most mm strength; she is I in HEP at this time.  A referral for custom AFO's would be beneficial to improve safety and I of walking. PT Plan: D/C to HEP    Goals Home Exercise Program Pt will Perform Home Exercise Program: Independently PT Goal: Perform Home Exercise Program - Progress: Met PT Short Term Goals PT Short Term Goal 1 - Progress: Met PT Short Term Goal 2 - Progress: Met PT Long Term Goals PT Long Term Goal 1 - Progress: Met PT Long Term Goal 2 - Progress: Progressing toward goal Long Term Goal 3: improving in ankle strength but slowly; pt is I in HEP Long Term Goal 3 Progress: Met Long Term Goal 4 Progress: Met Long Term Goal 5 Progress: Not met  Problem List Patient Active Problem List  Diagnoses  . UNSPECIFIED MONONEURITIS OF LOWER LIMB  . Closed fracture of tibial tubercle  . Osteoarthritis, knee  . Orthostatic hypotension  . Type II or unspecified type diabetes mellitus without mention of complication, uncontrolled  . Mixed hyperlipidemia  . Right leg weakness  . History of falling  . Abnormality of gait    PT - End of Session Activity Tolerance: Patient tolerated treatment well General Behavior During Session: [facility] for tasks performed Cognition: [facility] for tasks performed  Teal Bontrager,CINDY 10/22/2011, 12:04 PM  Physician Documentation Your signature is required to indicate  approval of the treatment plan as stated above.  Please sign and either send electronically or make a copy of this report for your files and return this physician signed original.   Please mark one 1.__approve of plan  2. ___approve of plan with the following conditions.   ______________________________                                                          _____________________ Physician Signature                                                                                                              Date

## 2011-10-22 NOTE — Patient Instructions (Signed)
Given new HEP concentrating on ankle strengthening

## 2012-06-16 ENCOUNTER — Other Ambulatory Visit (HOSPITAL_COMMUNITY): Payer: Self-pay | Admitting: Family Medicine

## 2012-06-16 DIAGNOSIS — Z139 Encounter for screening, unspecified: Secondary | ICD-10-CM

## 2012-06-22 ENCOUNTER — Ambulatory Visit (HOSPITAL_COMMUNITY)
Admission: RE | Admit: 2012-06-22 | Discharge: 2012-06-22 | Disposition: A | Discharge status: Home / Self Care | Payer: Medicare Other | Source: Ambulatory Visit | Attending: Family Medicine | Admitting: Family Medicine

## 2012-06-22 DIAGNOSIS — Z1231 Encounter for screening mammogram for malignant neoplasm of breast: Secondary | ICD-10-CM | POA: Insufficient documentation

## 2012-06-22 DIAGNOSIS — Z139 Encounter for screening, unspecified: Secondary | ICD-10-CM

## 2013-06-14 ENCOUNTER — Other Ambulatory Visit (HOSPITAL_COMMUNITY): Payer: Self-pay | Admitting: Family Medicine

## 2013-06-14 DIAGNOSIS — Z139 Encounter for screening, unspecified: Secondary | ICD-10-CM

## 2013-06-25 ENCOUNTER — Ambulatory Visit (HOSPITAL_COMMUNITY)
Admission: RE | Admit: 2013-06-25 | Discharge: 2013-06-25 | Disposition: A | Discharge status: Home / Self Care | Payer: Medicare Other | Source: Ambulatory Visit | Attending: Family Medicine | Admitting: Family Medicine

## 2013-06-25 DIAGNOSIS — Z139 Encounter for screening, unspecified: Secondary | ICD-10-CM

## 2013-06-25 DIAGNOSIS — Z1231 Encounter for screening mammogram for malignant neoplasm of breast: Secondary | ICD-10-CM | POA: Insufficient documentation

## 2014-06-13 ENCOUNTER — Other Ambulatory Visit (HOSPITAL_COMMUNITY): Payer: Self-pay | Admitting: Family Medicine

## 2014-06-13 DIAGNOSIS — Z1231 Encounter for screening mammogram for malignant neoplasm of breast: Secondary | ICD-10-CM

## 2014-06-28 ENCOUNTER — Ambulatory Visit (HOSPITAL_COMMUNITY): Payer: Medicare Other

## 2014-06-29 ENCOUNTER — Ambulatory Visit (HOSPITAL_COMMUNITY)
Admission: RE | Admit: 2014-06-29 | Discharge: 2014-06-29 | Disposition: A | Discharge status: Home / Self Care | Payer: Medicare Other | Source: Ambulatory Visit | Attending: Family Medicine | Admitting: Family Medicine

## 2014-06-29 DIAGNOSIS — Z1231 Encounter for screening mammogram for malignant neoplasm of breast: Secondary | ICD-10-CM | POA: Diagnosis present

## 2015-08-17 ENCOUNTER — Other Ambulatory Visit (HOSPITAL_COMMUNITY): Payer: Self-pay | Admitting: Family Medicine

## 2015-08-17 DIAGNOSIS — Z1231 Encounter for screening mammogram for malignant neoplasm of breast: Secondary | ICD-10-CM

## 2015-08-23 ENCOUNTER — Ambulatory Visit (HOSPITAL_COMMUNITY)
Admission: RE | Admit: 2015-08-23 | Discharge: 2015-08-23 | Disposition: A | Discharge status: Home / Self Care | Payer: Medicare Other | Source: Ambulatory Visit | Attending: Family Medicine | Admitting: Family Medicine

## 2015-08-23 DIAGNOSIS — Z1231 Encounter for screening mammogram for malignant neoplasm of breast: Secondary | ICD-10-CM | POA: Diagnosis not present

## 2015-10-06 ENCOUNTER — Other Ambulatory Visit (HOSPITAL_COMMUNITY): Payer: Self-pay | Admitting: Internal Medicine

## 2015-10-06 ENCOUNTER — Ambulatory Visit (HOSPITAL_COMMUNITY)
Admission: RE | Admit: 2015-10-06 | Discharge: 2015-10-06 | Disposition: A | Discharge status: Home / Self Care | Payer: Medicare Other | Source: Ambulatory Visit | Attending: Internal Medicine | Admitting: Internal Medicine

## 2015-10-06 DIAGNOSIS — R053 Chronic cough: Secondary | ICD-10-CM

## 2015-10-06 DIAGNOSIS — R05 Cough: Secondary | ICD-10-CM | POA: Diagnosis not present

## 2016-07-20 ENCOUNTER — Emergency Department (HOSPITAL_COMMUNITY): Payer: Medicare Other

## 2016-07-20 ENCOUNTER — Emergency Department (HOSPITAL_COMMUNITY)
Admission: EM | Admit: 2016-07-20 | Discharge: 2016-07-20 | Disposition: A | Discharge status: Home / Self Care | Payer: Medicare Other | Attending: Emergency Medicine | Admitting: Emergency Medicine

## 2016-07-20 ENCOUNTER — Encounter (HOSPITAL_COMMUNITY): Payer: Self-pay | Admitting: Emergency Medicine

## 2016-07-20 DIAGNOSIS — W1839XA Other fall on same level, initial encounter: Secondary | ICD-10-CM | POA: Diagnosis not present

## 2016-07-20 DIAGNOSIS — Y929 Unspecified place or not applicable: Secondary | ICD-10-CM | POA: Diagnosis not present

## 2016-07-20 DIAGNOSIS — Y9389 Activity, other specified: Secondary | ICD-10-CM | POA: Diagnosis not present

## 2016-07-20 DIAGNOSIS — E119 Type 2 diabetes mellitus without complications: Secondary | ICD-10-CM | POA: Insufficient documentation

## 2016-07-20 DIAGNOSIS — Z791 Long term (current) use of non-steroidal anti-inflammatories (NSAID): Secondary | ICD-10-CM | POA: Diagnosis not present

## 2016-07-20 DIAGNOSIS — Z79899 Other long term (current) drug therapy: Secondary | ICD-10-CM | POA: Diagnosis not present

## 2016-07-20 DIAGNOSIS — W19XXXA Unspecified fall, initial encounter: Secondary | ICD-10-CM

## 2016-07-20 DIAGNOSIS — Y999 Unspecified external cause status: Secondary | ICD-10-CM | POA: Diagnosis not present

## 2016-07-20 DIAGNOSIS — S82041A Displaced comminuted fracture of right patella, initial encounter for closed fracture: Secondary | ICD-10-CM | POA: Insufficient documentation

## 2016-07-20 DIAGNOSIS — Z7982 Long term (current) use of aspirin: Secondary | ICD-10-CM | POA: Insufficient documentation

## 2016-07-20 DIAGNOSIS — S8991XA Unspecified injury of right lower leg, initial encounter: Secondary | ICD-10-CM | POA: Diagnosis present

## 2016-07-20 MED ORDER — HYDROCODONE-ACETAMINOPHEN 5-325 MG PO TABS: 1.0000 | ORAL_TABLET | Freq: Four times a day (QID) | ORAL | 0 refills | Status: DC | PRN

## 2016-07-20 NOTE — ED Provider Notes (Signed)
[place] DEPT Provider Note   CSN: [id number] Arrival date & time: 07/20/16  1226  By signing my name below, I, Hansel Feinstein, attest that this documentation has been prepared under the direction and in the presence of Carmin Muskrat, MD. Electronically Signed: Hansel Feinstein, ED Scribe. 07/20/16. 12:58 PM.     History   Chief Complaint Chief Complaint  Patient presents with  . Fall    HPI Sherry Weiss is a 70 y.o. female with h/o DM, diabetic neuropathy who presents to the Emergency Department complaining of moderate right knee pain and swelling s/p mechanical fall that occurred earlier this afternoon. Pt states she stepped off a curb, lost her balance and fell forward, landing directly on her right knee. She denies head injury or LOC. She also denies HA or confusion after falling. Pt states her pain is worsened with weight-bearing and ambulation. She notes mild pain relief with ice application in the ED. Pt reports h/o left knee injury 2013. No h/o knee, hip or ankle surgery. Pt states she felt otherwise well prior to falling. She denies ankle pain, hip pain, left knee pain, additional injuries or complaints.   The history is provided by the patient. No language interpreter was used.    Past Medical History:  Diagnosis Date  . Hyperlipidemia   . Osteoarthritis, knee   . Type 2 diabetes mellitus [facility])     Patient Active Problem List   Diagnosis Date Noted  . Right leg weakness 08/29/2011  . History of falling 08/29/2011  . Abnormality of gait 08/29/2011  . Orthostatic hypotension 07/11/2011  . Type II or unspecified type diabetes mellitus without mention of complication, uncontrolled 07/11/2011  . Mixed hyperlipidemia 07/11/2011  . Osteoarthritis, knee 06/28/2011  . Closed fracture of tibial tubercle 02/13/2011  . UNSPECIFIED MONONEURITIS OF LOWER LIMB 06/06/2010    Past Surgical History:  Procedure Laterality Date  . CESAREAN SECTION      OB History    Gravida  Para Term Preterm AB Living   '3 3 3         '$ SAB TAB Ectopic Multiple Live Births                   Home Medications    Prior to Admission medications   Medication Sig Start Date End Date Taking? Authorizing Provider  aspirin 81 MG tablet Take 81 mg by mouth daily.      Historical Provider, MD  gabapentin (NEURONTIN) 300 MG capsule 300 mg 3 (three) times daily.  01/13/11   Historical Provider, MD  HYDROcodone-acetaminophen (VICODIN ES) 7.5-750 MG per tablet  01/13/11   Historical Provider, MD  KOMBIGLYZE XR 2.12-998 MG TB24 2 (two) times daily.  01/05/11   Historical Provider, MD  meloxicam (MOBIC) 15 MG tablet 15 mg daily.  01/07/11   Historical Provider, MD  PredniSONE 10 MG KIT DS 12 day 08/15/11   Carole Civil, MD  Rosuvastatin Calcium (CRESTOR PO) Take by mouth.      Historical Provider, MD    Family History Family History  Problem Relation Age of Onset  . Heart disease    . Arthritis    . Cancer    . Diabetes    . Hypertension Mother   . Cancer Sister     Social History Social History  Substance Use Topics  . Smoking status: Never Smoker  . Smokeless tobacco: Never Used  . Alcohol use No     Allergies   Patient has  no known allergies.   Review of Systems Review of Systems  Constitutional:       Per HPI, otherwise negative  HENT:       Per HPI, otherwise negative  Respiratory:       Per HPI, otherwise negative  Cardiovascular:       Per HPI, otherwise negative  Gastrointestinal: Negative for vomiting.  Endocrine:       Negative aside from HPI  Genitourinary:       Neg aside from HPI   Musculoskeletal:       Per HPI, otherwise negative  Skin: Negative.   Neurological: Negative for syncope.     Physical Exam Updated Vital Signs BP 162/60 (BP Location: Left Arm)   Pulse 93   Temp 97.8 F (36.6 C) (Oral)   Resp 18   Ht '4\' 11"'$  (1.499 m)   Wt 130 lb (59 kg)   SpO2 100%   BMI 26.26 kg/m   Physical Exam  Constitutional: She is oriented to  person, place, and time. She appears well-developed and well-nourished. No distress.  HENT:  Head: Normocephalic and atraumatic.  Eyes: Conjunctivae and EOM are normal.  Cardiovascular: Normal rate and regular rhythm.   Pulmonary/Chest: Effort normal and breath sounds normal. No stridor. No respiratory distress.  Abdominal: She exhibits no distension.  Musculoskeletal: She exhibits deformity. She exhibits no edema.       Right hip: Normal.       Left hip: Normal.       Right knee: She exhibits decreased range of motion, swelling, effusion, ecchymosis and deformity.       Right ankle: Normal.       Legs: Neurological: She is alert and oriented to person, place, and time. No cranial nerve deficit.  Skin: Skin is warm and dry.  Psychiatric: She has a normal mood and affect.  Nursing note and vitals reviewed.    ED Treatments / Results   COORDINATION OF CARE: 12:56 PM Discussed treatment plan with pt at bedside which includes XR and pt agreed to plan.    Radiology Dg Knee Complete 4 Views Right  Result Date: 07/20/2016 CLINICAL DATA:  Fall today.  Swelling around right patella EXAM: RIGHT KNEE - COMPLETE 4+ VIEW COMPARISON:  None. FINDINGS: There is a fracture through the inferior pole of the right patella. Fracture fragments are moderately displaced. Overlying soft tissue swelling and moderate joint effusion. IMPRESSION: Mildly comminuted, displaced fracture through the inferior pole of patella. Moderate joint effusion. Electronically Signed   By: Rolm Baptise M.D.   On: 07/20/2016 13:23    Procedures ORTHOPEDIC INJURY TREATMENT Date/Time: 07/20/2016 1:42 PM Performed by: Carmin Muskrat Authorized by: Carmin Muskrat  Consent: Verbal consent obtained. Risks and benefits: risks, benefits and alternatives were discussed Consent given by: patient Patient understanding: patient states understanding of the procedure being performed Patient consent: the patient's understanding of  the procedure matches consent given Procedure consent: procedure consent matches procedure scheduled Relevant documents: relevant documents present and verified Test results: test results available and properly labeled Site marked: the operative site was marked Imaging studies: imaging studies available Required items: required blood products, implants, devices, and special equipment available Patient identity confirmed: verbally with patient Time out: Immediately prior to procedure a "time out" was called to verify the correct patient, procedure, equipment, support staff and site/side marked as required. Injury location: knee Location details: right knee Injury type: fracture Fracture type: patellar Pre-procedure neurovascular assessment: neurovascularly intact Pre-procedure distal perfusion: normal  Pre-procedure neurological function: normal Pre-procedure range of motion: reduced  Anesthesia: Local anesthesia used: no  Sedation: Patient sedated: no Manipulation performed: no Splint type: long leg Supplies used: Preformed immobilization device. Post-procedure neurovascular assessment: post-procedure neurovascularly intact Post-procedure distal perfusion: normal Post-procedure neurological function: normal Post-procedure range of motion: unchanged Patient tolerance: Patient tolerated the procedure well with no immediate complications    (including critical care time)    Initial Impression / Assessment and Plan / ED Course  I have reviewed the triage vital signs and the nursing notes.  Pertinent labs & imaging results that were available during my care of the patient were reviewed by me and considered in my medical decision making (see chart for details).  Clinical Course     Patient presents after mechanical fall. Patient has full recall of the event, denies head pain, neck pain other complaints However, the patient does have an obvious deformity about the right knee  and x-rays demonstrate displaced fracture of the patella. Patient tolerated immobilization of the right leg well, was discharged with analgesia, to follow-up with orthopedics   I personally performed the services described in this documentation, which was scribed in my presence. The recorded information has been reviewed and is accurate.       Carmin Muskrat, MD 07/20/16 [id number]

## 2016-07-20 NOTE — ED Triage Notes (Signed)
Pt states she was coming out of a store and mis stepped causing a fall. Injury to R knee, obvious deformity noted. Pt with limited ROM, bruising and edema.

## 2016-07-22 ENCOUNTER — Ambulatory Visit (INDEPENDENT_AMBULATORY_CARE_PROVIDER_SITE_OTHER): Payer: Medicare Other | Admitting: Orthopedic Surgery

## 2016-07-22 ENCOUNTER — Encounter: Payer: Self-pay | Admitting: Orthopedic Surgery

## 2016-07-22 VITALS — BP 121/77 | HR 121

## 2016-07-22 DIAGNOSIS — S82042A Displaced comminuted fracture of left patella, initial encounter for closed fracture: Secondary | ICD-10-CM

## 2016-07-22 NOTE — Patient Instructions (Addendum)
SURGERY REQUIRED   You need to have surgery on your right knee  You cannot extend her knee and if the surgery is not done you will not be able to extend later the fracture will not heal without surgery We will perform open treatment internal fixation of the right patella   Some of the risks associated with knee replacement surgery include but are not limited to Bleeding Infection Swelling Stiffness Blood clot Pain that persists even after surgery Weakness of the right leg   Patellar Fracture, Adult A patellar fracture is a break in the kneecap (patella). The patella is located on the front of the knee. A patellar fracture may make it difficult to walk. What are the causes? This condition may be caused by:  A fall or a hard, direct hit (blow) to the knee.  A very hard and strong bending of the knee. What increases the risk? The following factors make you more likely to experience a patellar fracture:  Playing contact sports or motor sports, especially sports that involve a lot of jumping.  Having bone abnormalities or diseases of the bone, such as osteoporosis or a bone tumor.  Having poor strength and flexibility.  Having metabolism disorders, hormone problems, or nutrition deficiencies and disorders, such as anorexia or bulimia. What are the signs or symptoms? Symptoms of this condition include:  Tender and swollen knee.  Pain when moving the knee, especially when straightening it.  Difficulty walking or using the knee to support body weight (bearing weight).  Misshapen knee, as if a bone is out of place. How is this diagnosed? This condition is diagnosed based on:  Your symptoms and medical history.  A physical exam.  X-rays. How is this treated? Treatment for this condition depends on the type of fracture that you have:  If your patella is still in the right position after the fracture and you can still straighten your leg, you may need to wear a splint or  cast for 4-6 weeks.  If your patella is broken into multiple pieces but you are able to straighten your leg, you can usually be treated with a splint or cast for 4-6 weeks. In some cases, the patella may need to be removed before the cast is applied.  If you cannot straighten your leg after a patellar fracture, you will need to have surgery to hold the patella together until it heals. After surgery, a splint or cast will be applied for 4-6 weeks.  You may be prescribed medicine to help relieve pain or prevent infection. Follow these instructions at home: If you have a splint:  Wear the splint as told by your health care provider. Remove it only as told by your health care provider.  Loosen the splint if your toes tingle, become numb, or turn cold and blue.  Keep the splint clean.  If the splint is not waterproof:  Do not let it get wet.  Cover it with a watertight covering when you take a bath or a shower. If you have a cast:  Do not stick anything inside the cast to scratch your skin. Doing that increases your risk of infection.  Check the skin around the cast every day. Tell your health care provider about any concerns.  You may put lotion on dry skin around the edges of the cast. Do not put lotion on the skin underneath the cast.  Keep the cast clean.  If the cast is not waterproof:  Do not let it  get wet.  Cover it with a watertight covering when you take a bath or a shower. Managing pain, stiffness, and swelling  If directed, put ice on the injured area:  If you have a removable splint, remove it as told by your health care provider.  Put ice in a plastic bag.  Place a towel between your skin and the bag or between your cast and the bag.  Leave the ice on for 20 minutes, 2-3 times a day.  Move your toes often to avoid stiffness and to lessen swelling.  Raise (elevate) the injured area above the level of your heart while you are sitting or lying  down. Medicines  Take over-the-counter and prescription medicines only as told by your health care provider.  If you were prescribed an antibiotic medicine, use it as told by your health care provider. Do not stop taking the antibiotic even if you start to feel better. Activity  Return to your normal activities as told by your health care provider. Ask your health care provider what activities are safe for you.  Do exercises as told by your health care provider.  Ask your health care provider when it is safe to drive if you have a splint or cast on your leg.  Do not drive or use heavy machinery while taking prescription pain medicine. General instructions  Do not use the injured limb to support your body weight until your health care provider says that you can. Use crutches as told by your health care provider.  Do not use any products that contain nicotine or tobacco, such as cigarettes and e-cigarettes. These can delay bone healing. If you need help quitting, ask your health care provider.  Keep all follow-up visits as told by your health care provider. This is important. Contact a health care provider if:  You have symptoms that get worse or do not get better after 2 weeks of treatment.  You have severe, persistent pain. Get help right away if:  You have redness, swelling, or increasing pain in your knee.  You have a fever.  You have blue or gray skin below the fracture site or in the toes.  You have numbness or loss of feeling below the fracture site. This information is not intended to replace advice given to you by your health care provider. Make sure you discuss any questions you have with your health care provider. Document Released: 05/18/2003 Document Revised: 05/31/2016 Document Reviewed: 05/31/2016 Elsevier Interactive Patient Education  2017 [employer]  Patellar Fracture, Adult A patellar fracture is a break in the kneecap (patella). The patella is located on  the front of the knee. A patellar fracture may make it difficult to walk. What are the causes? This condition may be caused by:  A fall or a hard, direct hit (blow) to the knee.  A very hard and strong bending of the knee. What increases the risk? The following factors make you more likely to experience a patellar fracture:  Playing contact sports or motor sports, especially sports that involve a lot of jumping.  Having bone abnormalities or diseases of the bone, such as osteoporosis or a bone tumor.  Having poor strength and flexibility.  Having metabolism disorders, hormone problems, or nutrition deficiencies and disorders, such as anorexia or bulimia. What are the signs or symptoms? Symptoms of this condition include:  Tender and swollen knee.  Pain when moving the knee, especially when straightening it.  Difficulty walking or using the knee to  support body weight (bearing weight).  Misshapen knee, as if a bone is out of place. How is this diagnosed? This condition is diagnosed based on:  Your symptoms and medical history.  A physical exam.  X-rays. How is this treated? Treatment for this condition depends on the type of fracture that you have:  If your patella is still in the right position after the fracture and you can still straighten your leg, you may need to wear a splint or cast for 4-6 weeks.  If your patella is broken into multiple pieces but you are able to straighten your leg, you can usually be treated with a splint or cast for 4-6 weeks. In some cases, the patella may need to be removed before the cast is applied.  If you cannot straighten your leg after a patellar fracture, you will need to have surgery to hold the patella together until it heals. After surgery, a splint or cast will be applied for 4-6 weeks.  You may be prescribed medicine to help relieve pain or prevent infection. Follow these instructions at home: If you have a splint:  Wear the  splint as told by your health care provider. Remove it only as told by your health care provider.  Loosen the splint if your toes tingle, become numb, or turn cold and blue.  Keep the splint clean.  If the splint is not waterproof:  Do not let it get wet.  Cover it with a watertight covering when you take a bath or a shower. If you have a cast:  Do not stick anything inside the cast to scratch your skin. Doing that increases your risk of infection.  Check the skin around the cast every day. Tell your health care provider about any concerns.  You may put lotion on dry skin around the edges of the cast. Do not put lotion on the skin underneath the cast.  Keep the cast clean.  If the cast is not waterproof:  Do not let it get wet.  Cover it with a watertight covering when you take a bath or a shower. Managing pain, stiffness, and swelling  If directed, put ice on the injured area:  If you have a removable splint, remove it as told by your health care provider.  Put ice in a plastic bag.  Place a towel between your skin and the bag or between your cast and the bag.  Leave the ice on for 20 minutes, 2-3 times a day.  Move your toes often to avoid stiffness and to lessen swelling.  Raise (elevate) the injured area above the level of your heart while you are sitting or lying down. Medicines  Take over-the-counter and prescription medicines only as told by your health care provider.  If you were prescribed an antibiotic medicine, use it as told by your health care provider. Do not stop taking the antibiotic even if you start to feel better. Activity  Return to your normal activities as told by your health care provider. Ask your health care provider what activities are safe for you.  Do exercises as told by your health care provider.  Ask your health care provider when it is safe to drive if you have a splint or cast on your leg.  Do not drive or use heavy machinery  while taking prescription pain medicine. General instructions  Do not use the injured limb to support your body weight until your health care provider says that you can. Use crutches  as told by your health care provider.  Do not use any products that contain nicotine or tobacco, such as cigarettes and e-cigarettes. These can delay bone healing. If you need help quitting, ask your health care provider.  Keep all follow-up visits as told by your health care provider. This is important. Contact a health care provider if:  You have symptoms that get worse or do not get better after 2 weeks of treatment.  You have severe, persistent pain. Get help right away if:  You have redness, swelling, or increasing pain in your knee.  You have a fever.  You have blue or gray skin below the fracture site or in the toes.  You have numbness or loss of feeling below the fracture site. This information is not intended to replace advice given to you by your health care provider. Make sure you discuss any questions you have with your health care provider. Document Released: 05/18/2003 Document Revised: 05/31/2016 Document Reviewed: 05/31/2016 Elsevier Interactive Patient Education  2017 [employer]

## 2016-07-22 NOTE — Progress Notes (Signed)
Patient ID: Sherry PaulsWilma P Weiss, female   DOB: [birth year], 70 y.o.   MRN: [id number]  Chief Complaint  Patient presents with  . Knee Injury    right patella fracture s/p fall 07/20/16    HPI Sherry Weiss is a 70 y.o. female.  This is a 70 year old female who fell at the store going out of curb injured her right knee. X-ray shows an inferior pole fracture of the patella. She complains of pain over the distal pole of patella she has an effusion she has swelling she has an abrasion over the skin pain is moderate to severe constant. She is ambulating with a knee immobilizer  Review of Systems Review of Systems 1. No current fever 2. No chest pain or shortness of breath   Past Medical History:  Diagnosis Date  . Hyperlipidemia   . Osteoarthritis, knee   . Type 2 diabetes mellitus (HCC)     Past Surgical History:  Procedure Laterality Date  . CESAREAN SECTION      Social History Social History  Substance Use Topics  . Smoking status: Never Smoker  . Smokeless tobacco: Never Used  . Alcohol use No    No Known Allergies  No outpatient prescriptions have been marked as taking for the 07/22/16 encounter (Office Visit) with Vickki HearingStanley E Harrison, MD.      Physical Exam Physical Exam BP 121/77   Pulse (!) 121   Gen. appearance. The patient is well-developed and well-nourished, grooming and hygiene are normal. There are no gross congenital abnormalities  The patient is alert and oriented to person place and time  Mood and affect are normal  Ambulation She is walking with a brace on her right leg and a set of crutches Examination reveals the following: On inspection we find Abrasion over the knee with an effusion swelling tenderness  With the range of motion of  to can flex and a fine she has an extensor lag 30  Stability tests were normal    Strength tests revealed grade 2 quadricep strength  Skin we find healed abrasions   Sensory exam is normal   Distal pulses are  intact    Data Reviewed X-ray show a comminuted fracture inferior pole the patella with a large soft tissue swelling  Assessment    Encounter Diagnosis  Name Primary?  . Closed displaced comminuted fracture of left patella, initial encounter Yes       Plan    ORIF RIGHT PATELLA        Fuller CanadaStanley Harrison 07/22/2016, 10:29 AM

## 2016-07-23 ENCOUNTER — Encounter (HOSPITAL_COMMUNITY): Payer: Self-pay

## 2016-07-23 ENCOUNTER — Encounter: Payer: Self-pay | Admitting: *Deleted

## 2016-07-23 ENCOUNTER — Encounter (HOSPITAL_COMMUNITY)
Admission: RE | Admit: 2016-07-23 | Discharge: 2016-07-23 | Disposition: A | Discharge status: Home / Self Care | Payer: Medicare Other | Source: Ambulatory Visit | Attending: Orthopedic Surgery | Admitting: Orthopedic Surgery

## 2016-07-23 DIAGNOSIS — E119 Type 2 diabetes mellitus without complications: Secondary | ICD-10-CM | POA: Diagnosis not present

## 2016-07-23 DIAGNOSIS — Y998 Other external cause status: Secondary | ICD-10-CM | POA: Diagnosis not present

## 2016-07-23 DIAGNOSIS — Y92512 Supermarket, store or market as the place of occurrence of the external cause: Secondary | ICD-10-CM | POA: Diagnosis not present

## 2016-07-23 DIAGNOSIS — Z01810 Encounter for preprocedural cardiovascular examination: Secondary | ICD-10-CM

## 2016-07-23 DIAGNOSIS — S82031A Displaced transverse fracture of right patella, initial encounter for closed fracture: Secondary | ICD-10-CM | POA: Diagnosis present

## 2016-07-23 DIAGNOSIS — W101XXA Fall (on)(from) sidewalk curb, initial encounter: Secondary | ICD-10-CM | POA: Diagnosis not present

## 2016-07-23 DIAGNOSIS — M1710 Unilateral primary osteoarthritis, unspecified knee: Secondary | ICD-10-CM | POA: Diagnosis not present

## 2016-07-23 DIAGNOSIS — Z01812 Encounter for preprocedural laboratory examination: Secondary | ICD-10-CM | POA: Insufficient documentation

## 2016-07-23 DIAGNOSIS — Z7984 Long term (current) use of oral hypoglycemic drugs: Secondary | ICD-10-CM | POA: Diagnosis not present

## 2016-07-23 DIAGNOSIS — Y9389 Activity, other specified: Secondary | ICD-10-CM | POA: Diagnosis not present

## 2016-07-23 DIAGNOSIS — I10 Essential (primary) hypertension: Secondary | ICD-10-CM | POA: Diagnosis not present

## 2016-07-23 DIAGNOSIS — E785 Hyperlipidemia, unspecified: Secondary | ICD-10-CM | POA: Diagnosis not present

## 2016-07-23 HISTORY — DX: Essential (primary) hypertension: I10

## 2016-07-24 ENCOUNTER — Ambulatory Visit (HOSPITAL_COMMUNITY): Payer: Medicare Other

## 2016-07-24 ENCOUNTER — Encounter (HOSPITAL_COMMUNITY)
Admission: RE | Disposition: A | Discharge status: Home / Self Care | Payer: Self-pay | Source: Ambulatory Visit | Attending: Orthopedic Surgery

## 2016-07-24 ENCOUNTER — Ambulatory Visit (HOSPITAL_COMMUNITY): Payer: Medicare Other | Admitting: Anesthesiology

## 2016-07-24 ENCOUNTER — Encounter (HOSPITAL_COMMUNITY): Payer: Self-pay | Admitting: *Deleted

## 2016-07-24 ENCOUNTER — Ambulatory Visit (HOSPITAL_COMMUNITY)
Admission: RE | Admit: 2016-07-24 | Discharge: 2016-07-24 | Disposition: A | Discharge status: Home / Self Care | Payer: Medicare Other | Source: Ambulatory Visit | Attending: Orthopedic Surgery | Admitting: Orthopedic Surgery

## 2016-07-24 DIAGNOSIS — S82031A Displaced transverse fracture of right patella, initial encounter for closed fracture: Secondary | ICD-10-CM | POA: Insufficient documentation

## 2016-07-24 DIAGNOSIS — W101XXA Fall (on)(from) sidewalk curb, initial encounter: Secondary | ICD-10-CM | POA: Insufficient documentation

## 2016-07-24 DIAGNOSIS — M1710 Unilateral primary osteoarthritis, unspecified knee: Secondary | ICD-10-CM | POA: Insufficient documentation

## 2016-07-24 DIAGNOSIS — E119 Type 2 diabetes mellitus without complications: Secondary | ICD-10-CM | POA: Diagnosis not present

## 2016-07-24 DIAGNOSIS — Y998 Other external cause status: Secondary | ICD-10-CM | POA: Insufficient documentation

## 2016-07-24 DIAGNOSIS — I10 Essential (primary) hypertension: Secondary | ICD-10-CM | POA: Diagnosis not present

## 2016-07-24 DIAGNOSIS — E785 Hyperlipidemia, unspecified: Secondary | ICD-10-CM | POA: Diagnosis not present

## 2016-07-24 DIAGNOSIS — Y92512 Supermarket, store or market as the place of occurrence of the external cause: Secondary | ICD-10-CM | POA: Insufficient documentation

## 2016-07-24 DIAGNOSIS — Y9389 Activity, other specified: Secondary | ICD-10-CM | POA: Insufficient documentation

## 2016-07-24 DIAGNOSIS — Z7984 Long term (current) use of oral hypoglycemic drugs: Secondary | ICD-10-CM | POA: Insufficient documentation

## 2016-07-24 DIAGNOSIS — T148XXA Other injury of unspecified body region, initial encounter: Secondary | ICD-10-CM

## 2016-07-24 HISTORY — PX: ORIF PATELLA: SHX5033

## 2016-07-24 LAB — BASIC METABOLIC PANEL
ANION GAP: 9 (ref 5–15)
BUN: 22 mg/dL — ABNORMAL HIGH (ref 6–20)
CALCIUM: 9.3 mg/dL (ref 8.9–10.3)
CO2: 27 mmol/L (ref 22–32)
Chloride: 102 mmol/L (ref 101–111)
Creatinine, Ser: 1.12 mg/dL — ABNORMAL HIGH (ref 0.44–1.00)
GFR calc non Af Amer: 48 mL/min — ABNORMAL LOW (ref >60)
GFR, EST AFRICAN AMERICAN: 55 mL/min — AB (ref >60)
Glucose, Bld: 136 mg/dL — ABNORMAL HIGH (ref 65–99)
POTASSIUM: 3.7 mmol/L (ref 3.5–5.1)
Sodium: 138 mmol/L (ref 135–145)

## 2016-07-24 LAB — CBC WITH DIFFERENTIAL/PLATELET
BASOS ABS: 0.1 10*3/uL (ref 0.0–0.1)
BASOS PCT: 1 %
Eosinophils Absolute: 0.1 10*3/uL (ref 0.0–0.7)
Eosinophils Relative: 1 %
HEMATOCRIT: 29.9 % — AB (ref 36.0–46.0)
HEMOGLOBIN: 9.7 g/dL — AB (ref 12.0–15.0)
Lymphocytes Relative: 12 %
Lymphs Abs: 0.7 10*3/uL (ref 0.7–4.0)
MCH: 29.5 pg (ref 26.0–34.0)
MCHC: 32.4 g/dL (ref 30.0–36.0)
MCV: 90.9 fL (ref 78.0–100.0)
MONOS PCT: 9 %
Monocytes Absolute: 0.6 10*3/uL (ref 0.1–1.0)
NEUTROS ABS: 4.5 10*3/uL (ref 1.7–7.7)
NEUTROS PCT: 77 %
Platelets: 171 10*3/uL (ref 150–400)
RBC: 3.29 MIL/uL — ABNORMAL LOW (ref 3.87–5.11)
RDW: 13.2 % (ref 11.5–15.5)
WBC: 6 10*3/uL (ref 4.0–10.5)

## 2016-07-24 LAB — GLUCOSE, CAPILLARY: Glucose-Capillary: 125 mg/dL — ABNORMAL HIGH (ref 65–99)

## 2016-07-24 SURGERY — OPEN REDUCTION INTERNAL FIXATION (ORIF) PATELLA
Anesthesia: General | Site: Knee | Laterality: Right

## 2016-07-24 MED ORDER — FENTANYL CITRATE (PF) 100 MCG/2ML IJ SOLN
INTRAMUSCULAR | Status: DC | PRN
  Administered 2016-07-24 (×8): 25 ug via INTRAVENOUS

## 2016-07-24 MED ORDER — MIDAZOLAM HCL 2 MG/2ML IJ SOLN
INTRAMUSCULAR | Status: AC
  Filled 2016-07-24: qty 2

## 2016-07-24 MED ORDER — MIDAZOLAM HCL 2 MG/2ML IJ SOLN
1.0000 mg | INTRAMUSCULAR | Status: DC | PRN
  Administered 2016-07-24: 2 mg via INTRAVENOUS

## 2016-07-24 MED ORDER — FENTANYL CITRATE (PF) 100 MCG/2ML IJ SOLN
25.0000 ug | INTRAMUSCULAR | Status: DC | PRN
  Administered 2016-07-24: 50 ug via INTRAVENOUS
  Filled 2016-07-24: qty 2

## 2016-07-24 MED ORDER — BUPIVACAINE IN DEXTROSE 0.75-8.25 % IT SOLN
INTRATHECAL | Status: AC
  Filled 2016-07-24: qty 2

## 2016-07-24 MED ORDER — FENTANYL CITRATE (PF) 100 MCG/2ML IJ SOLN
INTRAMUSCULAR | Status: AC
  Filled 2016-07-24: qty 2

## 2016-07-24 MED ORDER — CEFAZOLIN SODIUM-DEXTROSE 2-4 GM/100ML-% IV SOLN
2.0000 g | INTRAVENOUS | Status: AC
  Administered 2016-07-24: 2 g via INTRAVENOUS

## 2016-07-24 MED ORDER — CHLORHEXIDINE GLUCONATE 4 % EX LIQD: 60.0000 mL | Freq: Once | CUTANEOUS | Status: DC

## 2016-07-24 MED ORDER — PHENYLEPHRINE HCL 10 MG/ML IJ SOLN
INTRAMUSCULAR | Status: DC | PRN
  Administered 2016-07-24 (×2): 40 ug via INTRAVENOUS

## 2016-07-24 MED ORDER — HYDROCODONE-ACETAMINOPHEN 5-325 MG PO TABS: 1.0000 | ORAL_TABLET | Freq: Four times a day (QID) | ORAL | 0 refills | Status: DC | PRN

## 2016-07-24 MED ORDER — EPHEDRINE SULFATE 50 MG/ML IJ SOLN
INTRAMUSCULAR | Status: AC
  Filled 2016-07-24: qty 1

## 2016-07-24 MED ORDER — SODIUM CHLORIDE 0.9 % IJ SOLN
INTRAMUSCULAR | Status: AC
  Filled 2016-07-24: qty 10

## 2016-07-24 MED ORDER — BUPIVACAINE-EPINEPHRINE (PF) 0.25% -1:200000 IJ SOLN
INTRAMUSCULAR | Status: AC
  Filled 2016-07-24: qty 60

## 2016-07-24 MED ORDER — ONDANSETRON HCL 4 MG/2ML IJ SOLN
INTRAMUSCULAR | Status: AC
  Filled 2016-07-24: qty 2

## 2016-07-24 MED ORDER — BUPIVACAINE-EPINEPHRINE 0.25% -1:200000 IJ SOLN
INTRAMUSCULAR | Status: DC | PRN
  Administered 2016-07-24: 60 mL

## 2016-07-24 MED ORDER — LIDOCAINE HCL (PF) 1 % IJ SOLN
INTRAMUSCULAR | Status: AC
  Filled 2016-07-24: qty 10

## 2016-07-24 MED ORDER — LIDOCAINE HCL 1 % IJ SOLN
INTRAMUSCULAR | Status: DC | PRN
  Administered 2016-07-24: 30 mg via INTRADERMAL

## 2016-07-24 MED ORDER — CEFAZOLIN SODIUM-DEXTROSE 2-4 GM/100ML-% IV SOLN
INTRAVENOUS | Status: AC
  Filled 2016-07-24: qty 100

## 2016-07-24 MED ORDER — EPHEDRINE SULFATE 50 MG/ML IJ SOLN
INTRAMUSCULAR | Status: DC | PRN
  Administered 2016-07-24: 10 mg via INTRAVENOUS

## 2016-07-24 MED ORDER — ONDANSETRON HCL 4 MG/2ML IJ SOLN
4.0000 mg | Freq: Once | INTRAMUSCULAR | Status: AC
  Administered 2016-07-24: 4 mg via INTRAVENOUS

## 2016-07-24 MED ORDER — LACTATED RINGERS IV SOLN
INTRAVENOUS | Status: DC
  Administered 2016-07-24: 12:00:00 via INTRAVENOUS

## 2016-07-24 MED ORDER — PROPOFOL 10 MG/ML IV BOLUS
INTRAVENOUS | Status: DC | PRN
  Administered 2016-07-24: 120 mg via INTRAVENOUS

## 2016-07-24 MED ORDER — SODIUM CHLORIDE 0.9 % IR SOLN
Status: DC | PRN
  Administered 2016-07-24: 1000 mL

## 2016-07-24 MED ORDER — FENTANYL CITRATE (PF) 100 MCG/2ML IJ SOLN
25.0000 ug | Freq: Once | INTRAMUSCULAR | Status: AC
  Administered 2016-07-24: 25 ug via INTRAVENOUS

## 2016-07-24 MED ORDER — PROPOFOL 10 MG/ML IV BOLUS
INTRAVENOUS | Status: AC
  Filled 2016-07-24: qty 20

## 2016-07-24 SURGICAL SUPPLY — 58 items
ANCHOR CORKSCREW 6.5 FIBERWIRE (Anchor) ×3 IMPLANT
BAG HAMPER (MISCELLANEOUS) ×3 IMPLANT
BANDAGE ELASTIC 4 LF NS (GAUZE/BANDAGES/DRESSINGS) ×3 IMPLANT
BANDAGE ELASTIC 6 LF NS (GAUZE/BANDAGES/DRESSINGS) ×3 IMPLANT
BANDAGE ELASTIC 6 VELCRO NS (GAUZE/BANDAGES/DRESSINGS) ×3 IMPLANT
BANDAGE ESMARK 6X9 LF (GAUZE/BANDAGES/DRESSINGS) ×1 IMPLANT
BIT DRILL 2.8X128 (BIT) IMPLANT
BIT DRILL 2.8X128MM (BIT)
BLADE SURG SZ10 CARB STEEL (BLADE) ×3 IMPLANT
BNDG COHESIVE 4X5 TAN STRL (GAUZE/BANDAGES/DRESSINGS) ×3 IMPLANT
BNDG ESMARK 6X9 LF (GAUZE/BANDAGES/DRESSINGS) ×3
CHLORAPREP W/TINT 26ML (MISCELLANEOUS) ×3 IMPLANT
CLOTH BEACON ORANGE TIMEOUT ST (SAFETY) ×3 IMPLANT
COVER LIGHT HANDLE STERIS (MISCELLANEOUS) ×6 IMPLANT
CUFF TOURNIQUET SINGLE 34IN LL (TOURNIQUET CUFF) ×3 IMPLANT
DECANTER SPIKE VIAL GLASS SM (MISCELLANEOUS) ×6 IMPLANT
DRAPE C-ARM FOLDED MOBILE STRL (DRAPES) ×3 IMPLANT
DRAPE INCISE IOBAN 66X45 STRL (DRAPES) ×3 IMPLANT
DRAPE PROXIMA HALF (DRAPES) ×3 IMPLANT
DRSG MEPILEX BORDER 4X12 (GAUZE/BANDAGES/DRESSINGS) ×3 IMPLANT
ELECT REM PT RETURN 9FT ADLT (ELECTROSURGICAL) ×3
ELECTRODE REM PT RTRN 9FT ADLT (ELECTROSURGICAL) ×1 IMPLANT
GAUZE SPONGE 4X4 12PLY STRL (GAUZE/BANDAGES/DRESSINGS) ×3 IMPLANT
GAUZE XEROFORM 5X9 LF (GAUZE/BANDAGES/DRESSINGS) ×3 IMPLANT
GLOVE BIOGEL PI IND STRL 7.0 (GLOVE) ×1 IMPLANT
GLOVE BIOGEL PI INDICATOR 7.0 (GLOVE) ×2
GLOVE SKINSENSE NS SZ8.0 LF (GLOVE) ×2
GLOVE SKINSENSE STRL SZ8.0 LF (GLOVE) ×1 IMPLANT
GLOVE SS N UNI LF 8.5 STRL (GLOVE) ×3 IMPLANT
GOWN STRL REUS W/ TWL LRG LVL3 (GOWN DISPOSABLE) ×2 IMPLANT
GOWN STRL REUS W/TWL LRG LVL3 (GOWN DISPOSABLE) ×4
GOWN STRL REUS W/TWL XL LVL3 (GOWN DISPOSABLE) ×3 IMPLANT
IMMOBILIZER KNEE 19 UNV (ORTHOPEDIC SUPPLIES) IMPLANT
INST SET MINOR BONE (KITS) ×3 IMPLANT
K-WIRE DBL PT .062 (WIRE) IMPLANT
KIT ROOM TURNOVER APOR (KITS) ×3 IMPLANT
MANIFOLD NEPTUNE II (INSTRUMENTS) ×3 IMPLANT
NEEDLE HYPO 21X1.5 SAFETY (NEEDLE) ×3 IMPLANT
NEEDLE MA TROC 1/2 (NEEDLE) ×3 IMPLANT
NS IRRIG 1000ML POUR BTL (IV SOLUTION) ×3 IMPLANT
PACK BASIC LIMB (CUSTOM PROCEDURE TRAY) ×3 IMPLANT
PAD ABD 5X9 TENDERSORB (GAUZE/BANDAGES/DRESSINGS) ×3 IMPLANT
PAD ARMBOARD 7.5X6 YLW CONV (MISCELLANEOUS) ×3 IMPLANT
PADDING WEBRIL 4 STERILE (GAUZE/BANDAGES/DRESSINGS) ×3 IMPLANT
PADDING WEBRIL 6 STERILE (GAUZE/BANDAGES/DRESSINGS) ×3 IMPLANT
PASSER SUT SWANSON 36MM LOOP (INSTRUMENTS) ×3 IMPLANT
SET BASIN LINEN APH (SET/KITS/TRAYS/PACK) ×3 IMPLANT
SPONGE LAP 18X18 X RAY DECT (DISPOSABLE) ×3 IMPLANT
STAPLER VISISTAT 35W (STAPLE) ×3 IMPLANT
SUT BRALON NAB BRD #1 30IN (SUTURE) ×3 IMPLANT
SUT ETHIBOND 5 LR DA (SUTURE) ×3 IMPLANT
SUT MON AB 0 CT1 (SUTURE) ×3 IMPLANT
SUT MON AB 2-0 CT1 36 (SUTURE) ×3 IMPLANT
SUT VIC AB 1 CT1 27 (SUTURE) ×2
SUT VIC AB 1 CT1 27XBRD ANTBC (SUTURE) ×1 IMPLANT
SYR 30ML LL (SYRINGE) ×3 IMPLANT
SYR BULB IRRIGATION 50ML (SYRINGE) ×3 IMPLANT
TOWEL OR 17X26 4PK STRL BLUE (TOWEL DISPOSABLE) ×3 IMPLANT

## 2016-07-24 NOTE — Anesthesia Preprocedure Evaluation (Signed)
Anesthesia Evaluation  Patient identified by MRN, date of birth, ID band Patient awake    Reviewed: Allergy & Precautions, NPO status , Patient's Chart, lab work & pertinent test results  Airway Mallampati: I  TM Distance: >3 FB Neck ROM: Full    Dental  (+) Edentulous Upper, Edentulous Lower   Pulmonary neg pulmonary ROS,    breath sounds clear to auscultation       Cardiovascular hypertension, Pt. on medications  Rhythm:Regular Rate:Normal     Neuro/Psych  Neuromuscular disease    GI/Hepatic negative GI ROS,   Endo/Other  diabetes, Type 2, Oral Hypoglycemic Agents  Renal/GU      Musculoskeletal  (+) Arthritis ,   Abdominal   Peds  Hematology   Anesthesia Other Findings   Reproductive/Obstetrics                             Anesthesia Physical Anesthesia Plan  ASA: II  Anesthesia Plan: General   Post-op Pain Management:    Induction: Intravenous  Airway Management Planned: LMA  Additional Equipment:   Intra-op Plan:   Post-operative Plan: Extubation in OR  Informed Consent: I have reviewed the patients History and Physical, chart, labs and discussed the procedure including the risks, benefits and alternatives for the proposed anesthesia with the patient or authorized representative who has indicated his/her understanding and acceptance.     Plan Discussed with:   Anesthesia Plan Comments:         Anesthesia Quick Evaluation

## 2016-07-24 NOTE — Anesthesia Procedure Notes (Signed)
Procedure Name: LMA Insertion Date/Time: 07/24/2016 12:31 PM Performed by: Glynn OctaveANIEL, Adrean Findlay E Pre-anesthesia Checklist: Patient identified, Patient being monitored, Emergency Drugs available, Timeout performed and Suction available Patient Re-evaluated:Patient Re-evaluated prior to inductionOxygen Delivery Method: Circle System Utilized Preoxygenation: Pre-oxygenation with 100% oxygen Intubation Type: IV induction Ventilation: Mask ventilation without difficulty LMA: LMA inserted LMA Size: 3.0 Number of attempts: 1 Placement Confirmation: positive ETCO2 and breath sounds checked- equal and bilateral

## 2016-07-24 NOTE — H&P (Signed)
Sherry Weiss is an 70 y.o. female.    Chief Complaint: Right knee pain  HPI: Sherry Weiss fell coming out of the store when she lost her balance stepping off a curb. She went to the emergency room and the x-ray showed an inferior pole patella fracture with comminution. She complains of pain over the distal aspect of the right patella of the right knee and this is associated with an effusion and swelling. She has a healed abrasion over the skin her pain is moderate but constant. She is able to ambulate with crutches and a knee immobilizer.  Past Medical History:  Diagnosis Date  . Hyperlipidemia   . Hypertension   . Osteoarthritis, knee   . Type 2 diabetes mellitus (HCC)     Past Surgical History:  Procedure Laterality Date  . CESAREAN SECTION     x2    Family History  Problem Relation Age of Onset  . Heart disease    . Arthritis    . Cancer    . Diabetes    . Hypertension Mother   . Cancer Sister    Social History:  reports that she has never smoked. She has never used smokeless tobacco. She reports that she does not drink alcohol or use drugs.  Allergies: No Known Allergies   Review of Systems  Musculoskeletal: Positive for joint pain.  All other systems reviewed and are negative.   There were no vitals taken for this visit. Physical Exam  Constitutional: She is oriented to person, place, and time. She appears well-nourished.  Eyes: Right eye exhibits no discharge. Left eye exhibits no discharge. No scleral icterus.  Neck: Neck supple. No JVD present. No tracheal deviation present.  Cardiovascular: Intact distal pulses.   Respiratory: Effort normal. No stridor.  GI: Soft. She exhibits no distension.  Neurological: She is alert and oriented to person, place, and time. She has normal reflexes. She exhibits normal muscle tone. Coordination normal.  Skin: Skin is warm and dry. Abrasion, bruising and ecchymosis noted. No rash noted. No pallor.  Psychiatric: She has a normal  mood and affect. Her behavior is normal. Thought content normal.    The right and left upper extremity are aligned normally without deformity. Range of motion is normal. She has no instability of the joints of the arms. Muscle tone and strength are normal grade 5 strength. Skin is intact pulses are good lymph nodes are negative and there is normal sensation and coordination throughout her upper extremities  On the left lower extremity we find full range of motion of the left knee hip and ankle with normal stability in all 3 joints. There is no muscle atrophy and normal muscle tone. The skin is normal without rash, lesion, ulceration or nodularity. Temperature and pulse normal no edema or swelling lymph nodes are negative sensation is intact reflexes are excellent coordination and balance are good considering the fracture and she is able to walk with a walker she has no malalignment  On the right side we find a swollen right knee with tenderness and a palpable defect at the inferior pole of patella there is an abrasion over the skin the range of motion of the knee passively is normal but painful. She has a 30 extensor lag on attempted straight leg raise. The knee remains stable muscle tone is normal she has obvious weakness in the right quadriceps muscle. Distal pulses intact she can feel normal sensation in the foot reflexes deferred at the knee.  Assessment/Plan The x-ray taken at the hospital was read as of patella fracture.  I have reviewed the x-ray my independent interpretation is that she has an inferior pole patella fracture with comminution and a high riding patella indicating disruption of the extensor mechanism. There is a large joint effusion.  Impression fracture inferior pole of patella displaced with disrupted extensor mechanism.  Casting is not an option for long-term function of the right lower extremity so we recommended surgery. We discussed the risks and benefits of the surgery  and the patient agreed to   open treatment internal fixation of the right knee-patella  Fuller CanadaStanley Juvia Aerts, MD 07/24/2016, 8:21 AM

## 2016-07-24 NOTE — Op Note (Signed)
07/24/2016  1:45 PM  PATIENT:  Sherry Weiss  70 y.o. female  PRE-OPERATIVE DIAGNOSIS:  RIGHT PATELLA FRACTURE  POST-OPERATIVE DIAGNOSIS:  RIGHT PATELLA FRACTURE  PROCEDURE:  Procedure(s): OPEN REDUCTION INTERNAL (ORIF) FIXATION PATELLA (Right)   Operative findings avulsion of the distal pole patella. Complete disruption of extensor mechanism.  I ignored the fracture fragments and repair the tendon leaving the bone in place.  I used 1 6. 5 suture anchor and one #5 Ethibond suture through 2 drill holes and tied over the top of the patella  Details of procedure after proper sprays and identification, chart review and site marking the patient was taken to surgery. 2 g of Ancef IV was given and she was prepped and draped sterilely with ChloraPrep  Timeout was completed  The limb was exsanguinated with a six-inch Esmarch  A midline incision was made subcutaneous tissue was divided in a manner to create full-thickness skin flaps. The fracture was identified debrided the joint was irrigated.  2 drill holes were placed in the patella with a 2.8 drill bit  #5 Ethibond suture was placed in the tendon starting at the tibial tubercle advancing proximally and then advancing through the drill holes. A 6.5 ArthroCare suture anchor was placed in those for suture and were passed proximal to distal. Using those as a traction suture I tunneled under the quadriceps tendon and tied the Ethibond suture over the patellar bone and then tied the other distal sutures. This gave an excellent repair; we were able to obtain a proximally 50 of flexion without any tension  AP lateral x-ray confirmed adequate reduction and re-*duration of the extensor mechanism  The retinacular tears were small number repaired with #1 Vicryl  The wound was irrigated and closed with 0 Monocryl and staples  Quarter percent Marcaine plain was injected a total of 60 mL   SURGEON:  Surgeon(s) and Role:    * Vickki HearingStanley E  Vianca Bracher, MD - Primary  PHYSICIAN ASSISTANT:   ASSISTANTS: cynthia wrenn   ANESTHESIA:   general  EBL:  Total I/O In: -  Out: 20 [Blood:20]  BLOOD ADMINISTERED:none  DRAINS: none   LOCAL MEDICATIONS USED:  MARCAINE   , LIDOCAINE  and Amount: 60 ml  SPECIMEN:  No Specimen  DISPOSITION OF SPECIMEN:  N/A  COUNTS:  YES  TOURNIQUET:   Total Tourniquet Time Documented: Thigh (Right) - 45 minutes Total: Thigh (Right) - 45 minutes   DICTATION: .Dragon Dictation  PLAN OF CARE: Discharge to home after PACU  PATIENT DISPOSITION:  PACU - hemodynamically stable.   Delay start of Pharmacological VTE agent (>24hrs) due to surgical blood loss or risk of bleeding: not applicable   [id number]

## 2016-07-24 NOTE — Brief Op Note (Signed)
07/24/2016  1:45 PM  PATIENT:  Sherry Weiss  70 y.o. female  PRE-OPERATIVE DIAGNOSIS:  RIGHT PATELLA FRACTURE  POST-OPERATIVE DIAGNOSIS:  RIGHT PATELLA FRACTURE  PROCEDURE:  Procedure(s): OPEN REDUCTION INTERNAL (ORIF) FIXATION PATELLA (Right)   Operative findings avulsion of the distal pole patella. Complete disruption of extensor mechanism.  I ignored the fracture fragments and repair the tendon leaving the bone in place.  I used 1 6. 5 suture anchor and one #5 Ethibond suture through 2 drill holes and tied over the top of the patella  Details of procedure after proper sprays and identification, chart review and site marking the patient was taken to surgery. 2 g of Ancef IV was given and she was prepped and draped sterilely with ChloraPrep  Timeout was completed  The limb was exsanguinated with a six-inch Esmarch  A midline incision was made subcutaneous tissue was divided in a manner to create full-thickness skin flaps. The fracture was identified debrided the joint was irrigated.  2 drill holes were placed in the patella with a 2.8 drill bit  #5 Ethibond suture was placed in the tendon starting at the tibial tubercle advancing proximally and then advancing through the drill holes. A 6.5 ArthroCare suture anchor was placed in those for suture and were passed proximal to distal. Using those as a traction suture I tunneled under the quadriceps tendon and tied the Ethibond suture over the patellar bone and then tied the other distal sutures. This gave an excellent repair; we were able to obtain a proximally 50 of flexion without any tension  AP lateral x-ray confirmed adequate reduction and re-*duration of the extensor mechanism  The retinacular tears were small number repaired with #1 Vicryl  The wound was irrigated and closed with 0 Monocryl and staples  Quarter percent Marcaine plain was injected a total of 60 mL   SURGEON:  Surgeon(s) and Role:    * Stanley E  Harrison, MD - Primary  PHYSICIAN ASSISTANT:   ASSISTANTS: cynthia wrenn   ANESTHESIA:   general  EBL:  Total I/O In: -  Out: 20 [Blood:20]  BLOOD ADMINISTERED:none  DRAINS: none   LOCAL MEDICATIONS USED:  MARCAINE   , LIDOCAINE  and Amount: 60 ml  SPECIMEN:  No Specimen  DISPOSITION OF SPECIMEN:  N/A  COUNTS:  YES  TOURNIQUET:   Total Tourniquet Time Documented: Thigh (Right) - 45 minutes Total: Thigh (Right) - 45 minutes   DICTATION: .Dragon Dictation  PLAN OF CARE: Discharge to home after PACU  PATIENT DISPOSITION:  PACU - hemodynamically stable.   Delay start of Pharmacological VTE agent (>24hrs) due to surgical blood loss or risk of bleeding: not applicable   [id number]  

## 2016-07-24 NOTE — Transfer of Care (Signed)
Immediate Anesthesia Transfer of Care Note  Patient: Sherry Weiss  Procedure(s) Performed: Procedure(s): OPEN REDUCTION INTERNAL (ORIF) FIXATION PATELLA (Right)  Patient Location: PACU  Anesthesia Type:General  Level of Consciousness: awake  Airway & Oxygen Therapy: Patient Spontanous Breathing  Post-op Assessment: Report given to RN  Post vital signs: Reviewed and stable  Last Vitals:  Vitals:   07/24/16 1205 07/24/16 1210  BP: 135/60 135/68  Pulse:    Resp: 12 11  Temp:      Last Pain:  Vitals:   07/24/16 1023  TempSrc: Oral      Patients Stated Pain Goal: 6 (07/24/16 1023)  Complications: No apparent anesthesia complications

## 2016-07-24 NOTE — Anesthesia Postprocedure Evaluation (Signed)
Anesthesia Post Note  Patient: Sherry Weiss  Procedure(s) Performed: Procedure(s) (LRB): OPEN REDUCTION INTERNAL (ORIF) FIXATION PATELLA (Right)  Patient location during evaluation: PACU Anesthesia Type: General Level of consciousness: awake and alert and oriented Pain management: pain level controlled Vital Signs Assessment: post-procedure vital signs reviewed and stable Respiratory status: spontaneous breathing Cardiovascular status: blood pressure returned to baseline Postop Assessment: no signs of nausea or vomiting Anesthetic complications: no    Last Vitals:  Vitals:   07/24/16 1415 07/24/16 1430  BP: 128/67 137/63  Pulse: 100 (!) 107  Resp: 17 20  Temp:      Last Pain:  Vitals:   07/24/16 1415  TempSrc:   PainSc: 5                  Argyle Gustafson

## 2016-07-24 NOTE — Interval H&P Note (Signed)
History and Physical Interval Note:  07/24/2016 12:19 PM  Sherry Weiss  has presented today for surgery, with the diagnosis of RIGHT PATELLA FRACTURE  The various methods of treatment have been discussed with the patient and family. After consideration of risks, benefits and other options for treatment, the patient has consented to  Procedure(s): OPEN REDUCTION INTERNAL (ORIF) FIXATION PATELLA (Right) as a surgical intervention .  The patient's history has been reviewed, patient examined, no change in status, stable for surgery.  I have reviewed the patient's chart and labs.  Questions were answered to the patient's satisfaction.     Fuller CanadaStanley Shakelia Scrivner

## 2016-07-29 ENCOUNTER — Encounter: Payer: Self-pay | Admitting: Orthopedic Surgery

## 2016-07-29 ENCOUNTER — Ambulatory Visit (INDEPENDENT_AMBULATORY_CARE_PROVIDER_SITE_OTHER): Payer: Medicare Other | Admitting: Orthopedic Surgery

## 2016-07-29 DIAGNOSIS — Z4889 Encounter for other specified surgical aftercare: Secondary | ICD-10-CM

## 2016-07-29 MED ORDER — HYDROCODONE-ACETAMINOPHEN 5-325 MG PO TABS: 1.0000 | ORAL_TABLET | Freq: Four times a day (QID) | ORAL | 0 refills | Status: DC | PRN

## 2016-07-29 NOTE — Progress Notes (Signed)
Patient ID: Sherry Weiss, female   DOB: Jan 27, 1946, 70 y.o.   MRN: [id number]  Post op visit   Chief Complaint  Patient presents with  . Follow-up    POST OP 1, ORIF RT PATELLA, DOS 07/24/16   Inferior pole patella fracture treated with suture repair  Wound looks good. Calf supple nontender  Encounter Diagnosis  Name Primary?  Marland Kitchen. Aftercare following surgery Yes     Patient placed in the immobilizer weight-bear as tolerated prescription refill follow-up for x-ray and staple removal

## 2016-07-30 ENCOUNTER — Encounter (HOSPITAL_COMMUNITY): Payer: Self-pay | Admitting: Orthopedic Surgery

## 2016-08-07 ENCOUNTER — Encounter: Payer: Self-pay | Admitting: Orthopedic Surgery

## 2016-08-07 ENCOUNTER — Ambulatory Visit (INDEPENDENT_AMBULATORY_CARE_PROVIDER_SITE_OTHER): Payer: Medicare Other

## 2016-08-07 ENCOUNTER — Ambulatory Visit (INDEPENDENT_AMBULATORY_CARE_PROVIDER_SITE_OTHER): Payer: Self-pay | Admitting: Orthopedic Surgery

## 2016-08-07 DIAGNOSIS — S82001D Unspecified fracture of right patella, subsequent encounter for closed fracture with routine healing: Secondary | ICD-10-CM | POA: Diagnosis not present

## 2016-08-07 NOTE — Progress Notes (Signed)
Patient ID: Lillia PaulsWilma P Rhody, female   DOB: 08-11-1946, 70 y.o.   MRN: [id number]  Post op visit   Chief Complaint  Patient presents with  . Follow-up    post op, ORIF RT PATELLA, DOS 07/24/16   Right patella fracture open treatment internal fixation postop day 14, x-rays show fractures in good position hardware is intact knee looks good no swelling staples taken out  Continue straight leg brace 4 weeks for x-ray  Encounter Diagnosis  Name Primary?  . Closed displaced fracture of right patella with routine healing, unspecified fracture morphology, subsequent encounter Yes

## 2016-08-21 ENCOUNTER — Telehealth: Payer: Self-pay | Admitting: Orthopaedic Surgery

## 2016-08-21 DIAGNOSIS — Z4889 Encounter for other specified surgical aftercare: Secondary | ICD-10-CM

## 2016-08-21 MED ORDER — HYDROCODONE-ACETAMINOPHEN 5-325 MG PO TABS: 1.0000 | ORAL_TABLET | Freq: Four times a day (QID) | ORAL | 0 refills | Status: DC | PRN

## 2016-08-21 NOTE — Telephone Encounter (Signed)
Routing to Dr Romeo Appleharrison to approve

## 2016-08-21 NOTE — Telephone Encounter (Signed)
Ok  I did it   She can pick up friday

## 2016-08-21 NOTE — Telephone Encounter (Signed)
Patient requests refill:  °HYDROcodone-acetaminophen (NORCO/VICODIN) 5-325 MG tablet 40 tablet  ° ° °

## 2016-08-21 NOTE — Telephone Encounter (Signed)
Routing to nurse/Dr AmerisourceBergen CorporationHarrison

## 2016-08-21 NOTE — Telephone Encounter (Signed)
Not my patient

## 2016-09-04 ENCOUNTER — Ambulatory Visit (INDEPENDENT_AMBULATORY_CARE_PROVIDER_SITE_OTHER): Payer: Self-pay | Admitting: Orthopedic Surgery

## 2016-09-04 ENCOUNTER — Ambulatory Visit (INDEPENDENT_AMBULATORY_CARE_PROVIDER_SITE_OTHER): Payer: Medicare Other

## 2016-09-04 ENCOUNTER — Encounter: Payer: Self-pay | Admitting: Orthopedic Surgery

## 2016-09-04 DIAGNOSIS — S82001D Unspecified fracture of right patella, subsequent encounter for closed fracture with routine healing: Secondary | ICD-10-CM

## 2016-09-04 DIAGNOSIS — Z4889 Encounter for other specified surgical aftercare: Secondary | ICD-10-CM

## 2016-09-04 MED ORDER — HYDROCODONE-ACETAMINOPHEN 5-325 MG PO TABS: 1.0000 | ORAL_TABLET | Freq: Four times a day (QID) | ORAL | 0 refills | Status: DC | PRN

## 2016-09-04 NOTE — Patient Instructions (Signed)
Apply neosporin 3 x a day   Bend and straighten the knee 20 times 3 times a day in the brace

## 2016-09-04 NOTE — Progress Notes (Signed)
Patient ID: Sherry Weiss, female   DOB: June 01, 1946, 71 y.o.   MRN: [id number]  Follow up visit/  postop visit status post open treatment internal fixation of patellar fracture  Chief Complaint  Patient presents with  . Follow-up    Right patella fracture, DOS 07/24/16     Doing well no complaints, would like prescription refill. Wound looks reasonably well need some Neosporin. Steri-Strips removed  Knee flexion 85 extension -5. She can now do a straight leg raise with a mild lag  X-rays show fracture healing with one of the suture anchors attached to the bony fragment migrated slightly distally but this is not affecting function  Placed in the range of motion brace to follow-up for advancement from 90 to 120. Start exercises 3 times a day 20 repetitions   Encounter Diagnosis  Name Primary?  . Closed displaced fracture of right patella with routine healing, unspecified fracture morphology, subsequent encounter Yes      11:57 AM Fuller CanadaStanley Geovanny Sartin, MD 09/04/2016

## 2016-10-21 ENCOUNTER — Encounter: Payer: Self-pay | Admitting: Orthopedic Surgery

## 2016-10-21 ENCOUNTER — Ambulatory Visit (INDEPENDENT_AMBULATORY_CARE_PROVIDER_SITE_OTHER): Payer: Self-pay | Admitting: Orthopedic Surgery

## 2016-10-21 DIAGNOSIS — Z4889 Encounter for other specified surgical aftercare: Secondary | ICD-10-CM

## 2016-10-21 DIAGNOSIS — S82001D Unspecified fracture of right patella, subsequent encounter for closed fracture with routine healing: Secondary | ICD-10-CM

## 2016-10-21 NOTE — Progress Notes (Signed)
Chief Complaint  Patient presents with  . Follow-up    RIGHT PATELLA FX, DOS 07/24/16   The patient had slight loss of fixation but she has full extension and 95 of flexion with no extensor lag  No tenderness at the knee around the patella fracture  She is into a economy hinged brace follow-up in 6 weeks

## 2016-12-02 ENCOUNTER — Ambulatory Visit: Payer: Medicare Other | Admitting: Orthopedic Surgery

## 2016-12-03 ENCOUNTER — Ambulatory Visit (INDEPENDENT_AMBULATORY_CARE_PROVIDER_SITE_OTHER): Payer: Medicare Other | Admitting: Orthopedic Surgery

## 2016-12-03 ENCOUNTER — Encounter: Payer: Self-pay | Admitting: Orthopedic Surgery

## 2016-12-03 DIAGNOSIS — S82001D Unspecified fracture of right patella, subsequent encounter for closed fracture with routine healing: Secondary | ICD-10-CM | POA: Diagnosis not present

## 2016-12-03 NOTE — Progress Notes (Signed)
Postop  Chief Complaint  Patient presents with  . Knee Injury    follow up right patella fx-DOS:07/24/16    Status post patella fracture treated with open treatment internal fixation she has active extension full flexion wound has healed  She is over 3 months out she's wearing a hinged knee brace which we will continue  Follow-up 3 months

## 2017-03-04 ENCOUNTER — Encounter: Payer: Self-pay | Admitting: Orthopedic Surgery

## 2017-03-04 ENCOUNTER — Ambulatory Visit (INDEPENDENT_AMBULATORY_CARE_PROVIDER_SITE_OTHER): Payer: Medicare Other | Admitting: Orthopedic Surgery

## 2017-03-04 DIAGNOSIS — S82001D Unspecified fracture of right patella, subsequent encounter for closed fracture with routine healing: Secondary | ICD-10-CM

## 2017-03-04 NOTE — Progress Notes (Signed)
Follow-up  Chief Complaint  Patient presents with  . Follow-up    RIGHT PATELLA FX, DOI 07/24/17    Patella fracture open treatment internal fixation 6 months ago  Complains of weakness occasionally mild pain  Review of Systems  Constitutional: Negative for chills and fever.  Neurological: Negative for tingling.   Right Knee Exam  Swelling: None Effusion: No  Tenderness  None  Range of Motion  Normal right knee ROM Extension: -5 Flexion:     120  Tests  Drawer:       Posterior - Negative  Comments:  She is walking without any support there is no limp Skin incision healed nicely One of the pins appears to be prominent but not threatening the skin Neurovascular exam is intact     Encounter Diagnosis  Name Primary?  . Closed displaced fracture of right patella with routine healing, unspecified fracture morphology, subsequent encounter Yes    xrays in nov

## 2017-07-21 ENCOUNTER — Ambulatory Visit: Payer: Medicare Other | Admitting: Orthopedic Surgery

## 2017-07-21 ENCOUNTER — Ambulatory Visit (INDEPENDENT_AMBULATORY_CARE_PROVIDER_SITE_OTHER): Payer: Medicare Other

## 2017-07-21 DIAGNOSIS — Z8781 Personal history of (healed) traumatic fracture: Secondary | ICD-10-CM

## 2017-07-21 DIAGNOSIS — Z967 Presence of other bone and tendon implants: Secondary | ICD-10-CM | POA: Diagnosis not present

## 2017-07-21 DIAGNOSIS — Z9889 Other specified postprocedural states: Secondary | ICD-10-CM

## 2017-07-21 NOTE — Progress Notes (Signed)
Follow-up visit  Chief complaint  Chief Complaint  Patient presents with  . Follow-up    [place]he[place] righ [place]    71 year old 1 year after inferior pole patella fracture treated with open reduction internal fixation  She has no complaints she says she is walking well moving about and taking care of her grandchildren  Review of systems she has no pain in her knee no swelling no catching locking.    BP 120/80   Pulse 98   Ht 4\' 11"  (1.499 m)   Wt 133 lb (60.3 kg)   BMI 26.86 kg/m  Her exam shows she walks without support there is no limp she has a well-healed incision over the front of her leg she has no swelling or flexion arc is 120 degrees her knee is stable her quadricep strength is 5 over manual muscle testing  Her neurovascular exam is intact  I ordered an x-ray and here is my interpretation of that x-ray   [facility]  Radiology report dictated by Dr. Romeo AppleHarrison  2 views right knee  Status post reattachment of inferior pole patella fracture with suture anchor  Follow-up x-rays at a year  X-rays showed maintenance of integrity of distal pole with slight distal distraction as noted after 1 of the postop x-rays during her treatment  Impression healed inferior pole fracture patella  She is released no follow-up as needed

## 2017-12-12 ENCOUNTER — Other Ambulatory Visit (HOSPITAL_COMMUNITY): Payer: Self-pay | Admitting: Internal Medicine

## 2017-12-12 DIAGNOSIS — Z1231 Encounter for screening mammogram for malignant neoplasm of breast: Secondary | ICD-10-CM

## 2017-12-18 ENCOUNTER — Ambulatory Visit (HOSPITAL_COMMUNITY)
Admission: RE | Admit: 2017-12-18 | Discharge: 2017-12-18 | Disposition: A | Discharge status: Home / Self Care | Payer: Medicare Other | Source: Ambulatory Visit | Attending: Internal Medicine | Admitting: Internal Medicine

## 2017-12-18 ENCOUNTER — Encounter (HOSPITAL_COMMUNITY): Payer: Self-pay

## 2017-12-18 DIAGNOSIS — Z1231 Encounter for screening mammogram for malignant neoplasm of breast: Secondary | ICD-10-CM | POA: Insufficient documentation

## 2018-02-11 IMAGING — RF DG C-ARM 61-120 MIN
1 series · 4 of 4 positions shown · non-contrast
Comparison: 07/20/2016

CLINICAL DATA: ORIF RIGHT patella

EXAM:
DG C-ARM 61-120 MIN; RIGHT KNEE - 3 VIEW

[Series 1: run · 4 of 4 slices shown]
[im 1/4]
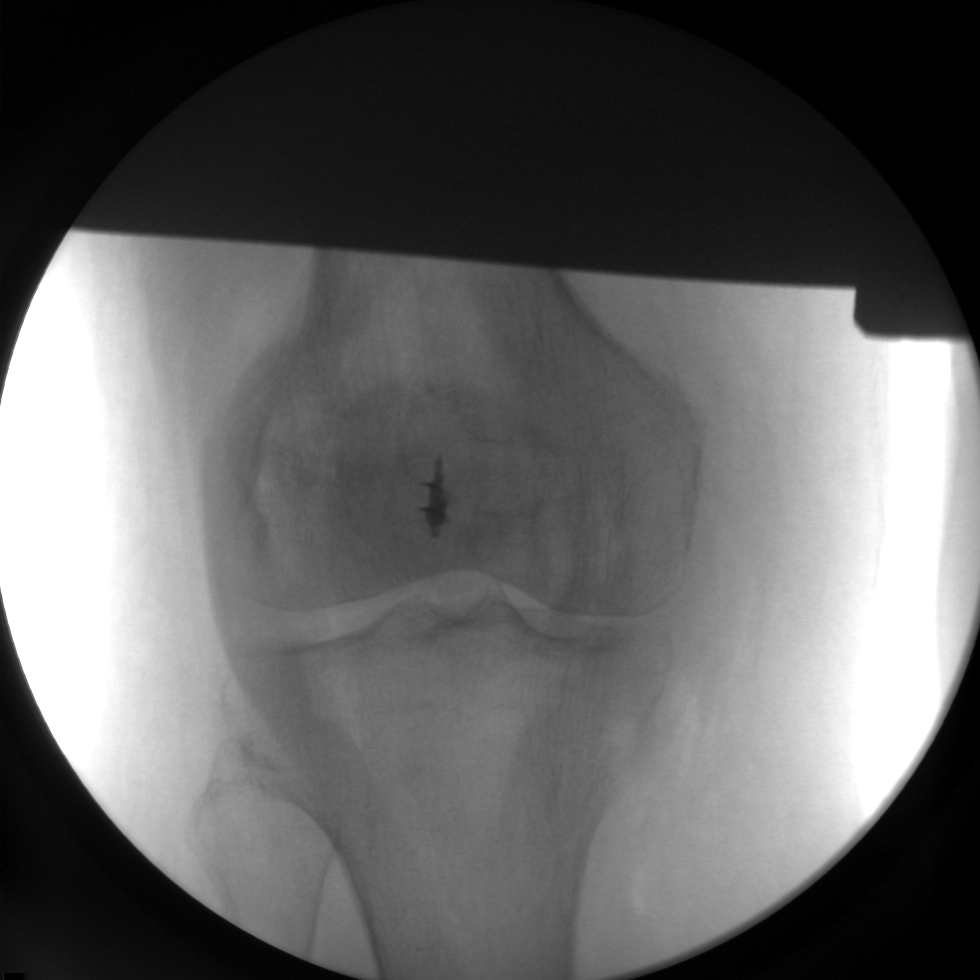
[im 2/4]
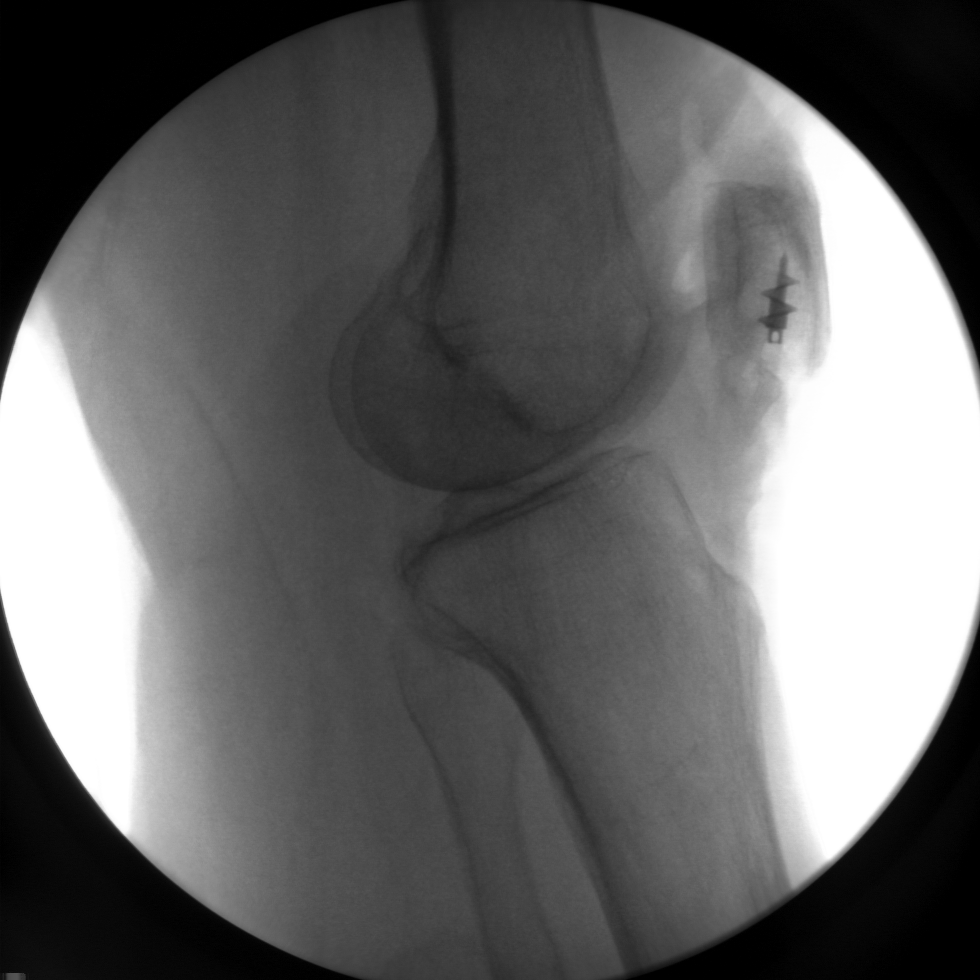
[im 3/4]
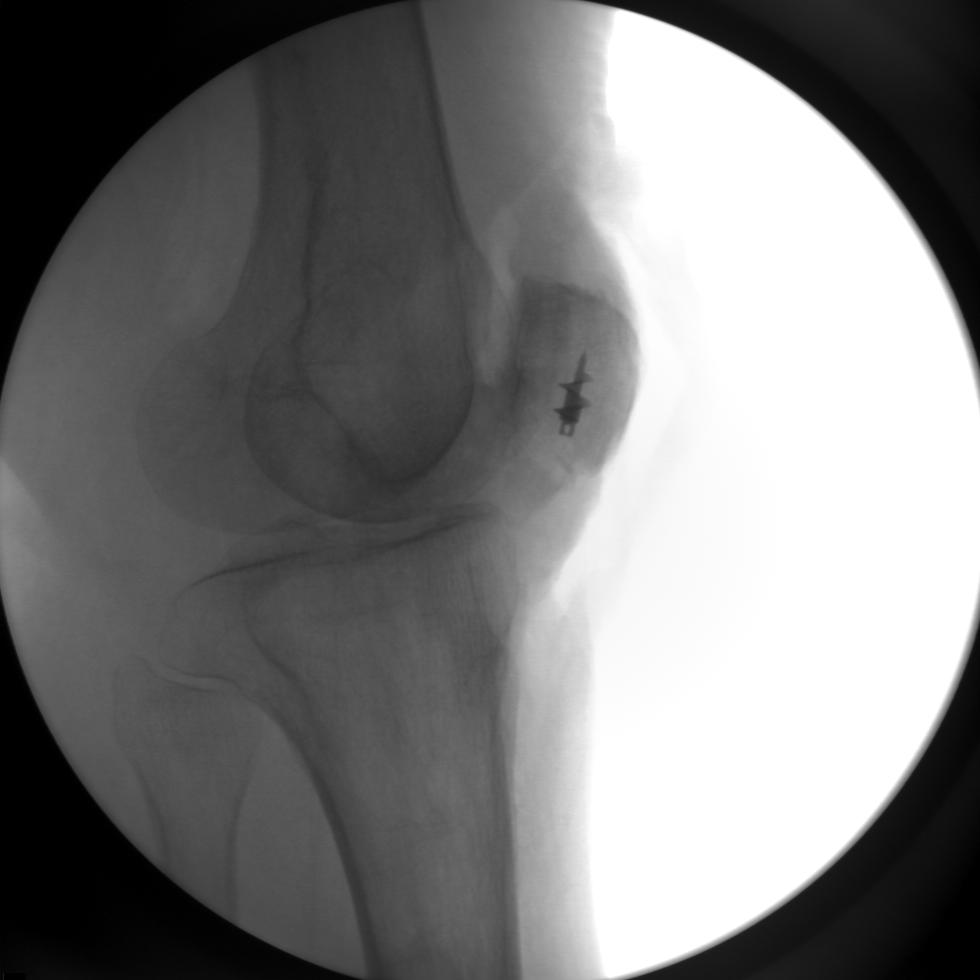
[im 4/4]
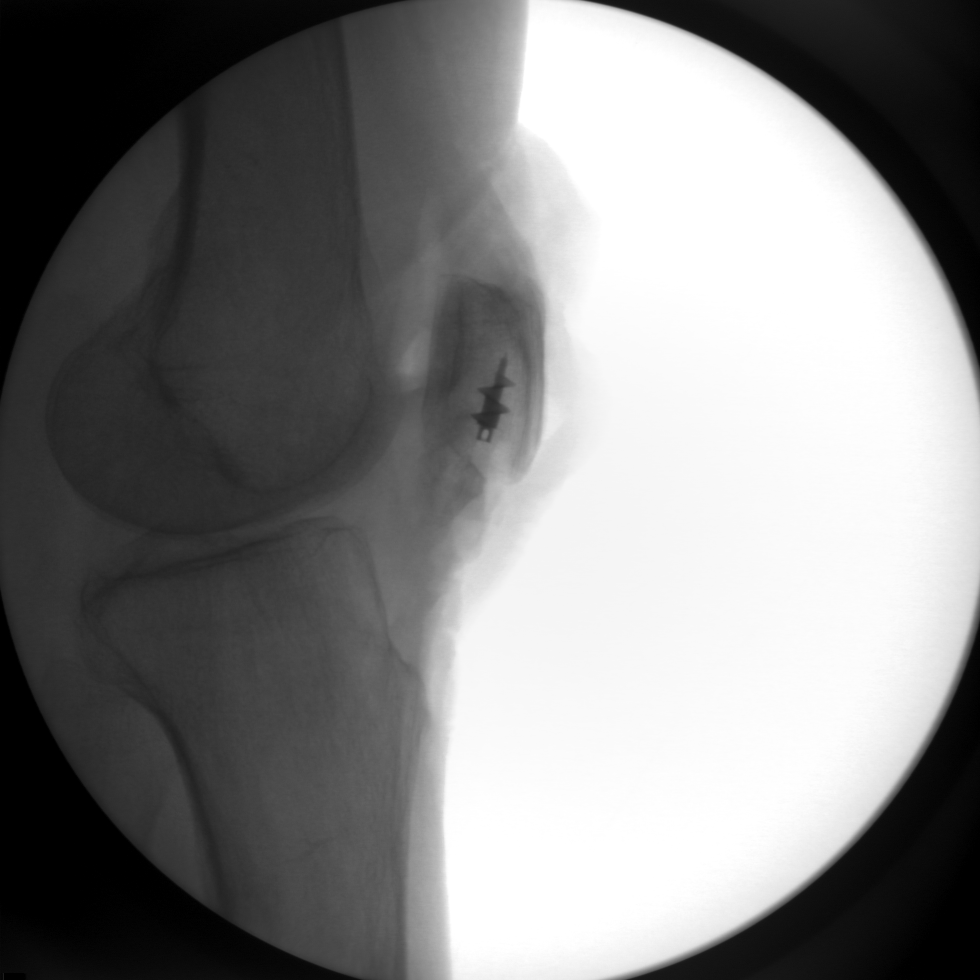

[4 of 4 positions shown; findings below may reference images not displayed]

FLUOROSCOPY TIME:  0 minutes 13 seconds

Dose:  1.05 mGy

Images obtained: 4 digital C-arm fluoroscopic images
FINDINGS: Images demonstrate placement of a single screw at the patella post
ORIF of a transverse patellar fracture at the inferior pole.

Patellar visualization is limited by the marked degree of osseous
demineralization.

Scattered joint space narrowing greatest at medial compartment.

No additional fractures identified.
IMPRESSION: Post ORIF of the RIGHT patella.

Osseous demineralization and degenerative changes of the RIGHT knee.

## 2019-02-02 ENCOUNTER — Other Ambulatory Visit (HOSPITAL_COMMUNITY): Payer: Self-pay | Admitting: Internal Medicine

## 2019-02-02 DIAGNOSIS — Z1231 Encounter for screening mammogram for malignant neoplasm of breast: Secondary | ICD-10-CM

## 2019-02-05 ENCOUNTER — Other Ambulatory Visit: Payer: Self-pay

## 2019-02-05 ENCOUNTER — Ambulatory Visit (HOSPITAL_COMMUNITY)
Admission: RE | Admit: 2019-02-05 | Discharge: 2019-02-05 | Disposition: A | Discharge status: Home / Self Care | Payer: Medicare Other | Source: Ambulatory Visit | Attending: Internal Medicine | Admitting: Internal Medicine

## 2019-02-05 DIAGNOSIS — Z1231 Encounter for screening mammogram for malignant neoplasm of breast: Secondary | ICD-10-CM | POA: Insufficient documentation

## 2020-02-09 ENCOUNTER — Other Ambulatory Visit (HOSPITAL_COMMUNITY): Payer: Self-pay | Admitting: Internal Medicine

## 2020-02-09 ENCOUNTER — Other Ambulatory Visit (HOSPITAL_COMMUNITY): Payer: Self-pay | Admitting: Adult Health

## 2020-02-09 ENCOUNTER — Other Ambulatory Visit (HOSPITAL_COMMUNITY): Payer: Self-pay | Admitting: Family Medicine

## 2020-02-09 DIAGNOSIS — Z780 Asymptomatic menopausal state: Secondary | ICD-10-CM

## 2020-02-09 DIAGNOSIS — Z1231 Encounter for screening mammogram for malignant neoplasm of breast: Secondary | ICD-10-CM

## 2020-02-14 ENCOUNTER — Ambulatory Visit (HOSPITAL_COMMUNITY)
Admission: RE | Admit: 2020-02-14 | Discharge: 2020-02-14 | Disposition: A | Discharge status: Home / Self Care | Payer: Medicare Other | Source: Ambulatory Visit | Attending: Adult Health | Admitting: Adult Health

## 2020-02-14 ENCOUNTER — Ambulatory Visit (HOSPITAL_COMMUNITY)
Admission: RE | Admit: 2020-02-14 | Discharge: 2020-02-14 | Disposition: A | Discharge status: Home / Self Care | Payer: Medicare Other | Source: Ambulatory Visit | Attending: Family Medicine | Admitting: Family Medicine

## 2020-02-14 ENCOUNTER — Other Ambulatory Visit: Payer: Self-pay

## 2020-02-14 DIAGNOSIS — M810 Age-related osteoporosis without current pathological fracture: Secondary | ICD-10-CM | POA: Insufficient documentation

## 2020-02-14 DIAGNOSIS — Z780 Asymptomatic menopausal state: Secondary | ICD-10-CM

## 2020-02-14 DIAGNOSIS — Z1231 Encounter for screening mammogram for malignant neoplasm of breast: Secondary | ICD-10-CM | POA: Insufficient documentation

## 2021-01-23 ENCOUNTER — Other Ambulatory Visit (HOSPITAL_COMMUNITY): Payer: Self-pay | Admitting: Internal Medicine

## 2021-01-23 DIAGNOSIS — Z1231 Encounter for screening mammogram for malignant neoplasm of breast: Secondary | ICD-10-CM

## 2021-02-08 ENCOUNTER — Ambulatory Visit (HOSPITAL_COMMUNITY): Payer: Medicare Other

## 2021-02-16 ENCOUNTER — Ambulatory Visit (HOSPITAL_COMMUNITY)
Admission: RE | Admit: 2021-02-16 | Discharge: 2021-02-16 | Disposition: A | Discharge status: Home / Self Care | Payer: Medicare Other | Source: Ambulatory Visit | Attending: Internal Medicine | Admitting: Internal Medicine

## 2021-02-16 DIAGNOSIS — Z1231 Encounter for screening mammogram for malignant neoplasm of breast: Secondary | ICD-10-CM | POA: Insufficient documentation

## 2022-03-26 ENCOUNTER — Other Ambulatory Visit (HOSPITAL_COMMUNITY): Payer: Self-pay | Admitting: Internal Medicine

## 2022-03-26 DIAGNOSIS — Z1231 Encounter for screening mammogram for malignant neoplasm of breast: Secondary | ICD-10-CM

## 2022-03-28 ENCOUNTER — Ambulatory Visit (HOSPITAL_COMMUNITY)
Admission: RE | Admit: 2022-03-28 | Discharge: 2022-03-28 | Disposition: A | Discharge status: Home / Self Care | Payer: Medicare Other | Source: Ambulatory Visit | Attending: Internal Medicine | Admitting: Internal Medicine

## 2022-03-28 DIAGNOSIS — Z1231 Encounter for screening mammogram for malignant neoplasm of breast: Secondary | ICD-10-CM | POA: Diagnosis not present

## 2022-09-06 IMAGING — MG MM DIGITAL SCREENING BILAT W/ TOMO AND CAD
6 of 10 series · 6 of 30 positions shown · non-contrast
Comparison: Previous exam(s).

CLINICAL DATA: Screening.

EXAM:
DIGITAL SCREENING BILATERAL MAMMOGRAM WITH TOMOSYNTHESIS AND CAD
TECHNIQUE: Bilateral screening digital craniocaudal and mediolateral oblique
mammograms were obtained. Bilateral screening digital breast
tomosynthesis was performed. The images were evaluated with
computer-aided detection.

[R MLO synth-2D (1 of 2)]
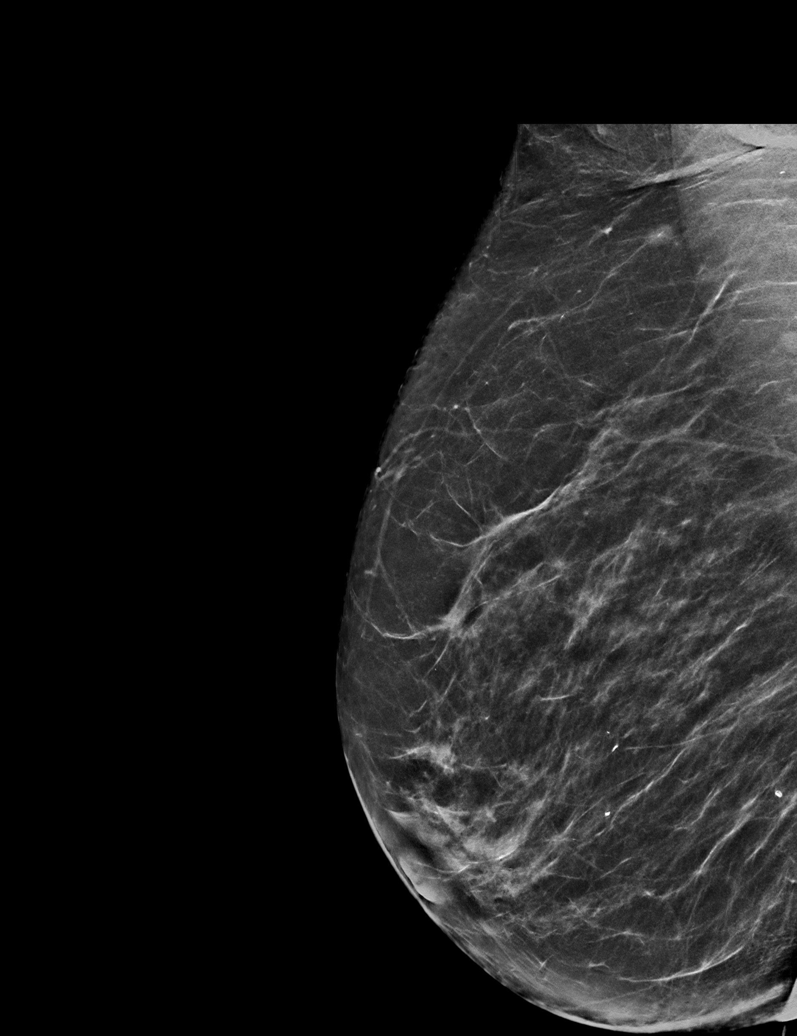

[R CC synth-2D]
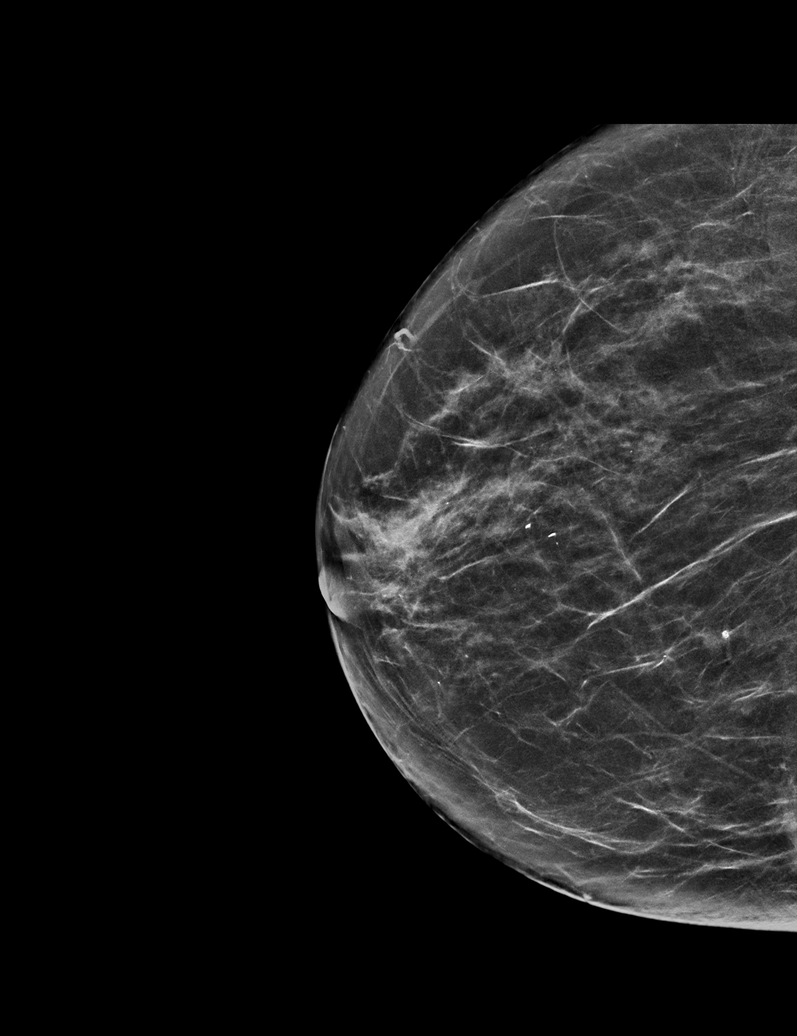

[L MLO synth-2D]
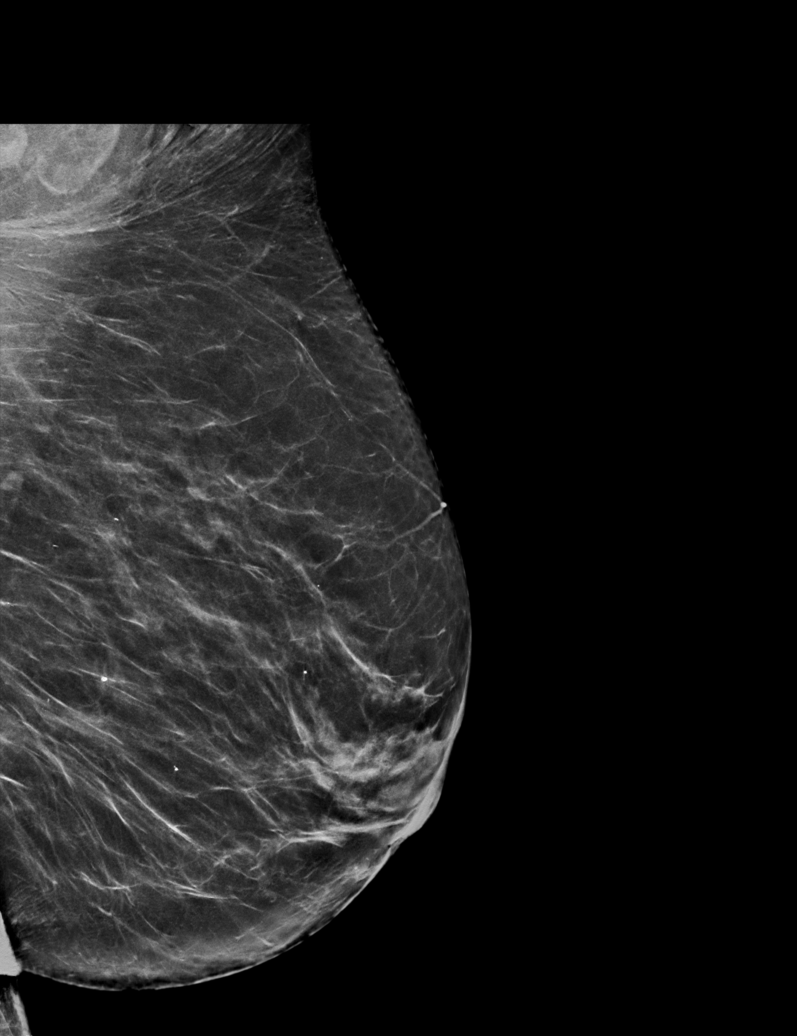

[L CC synth-2D]
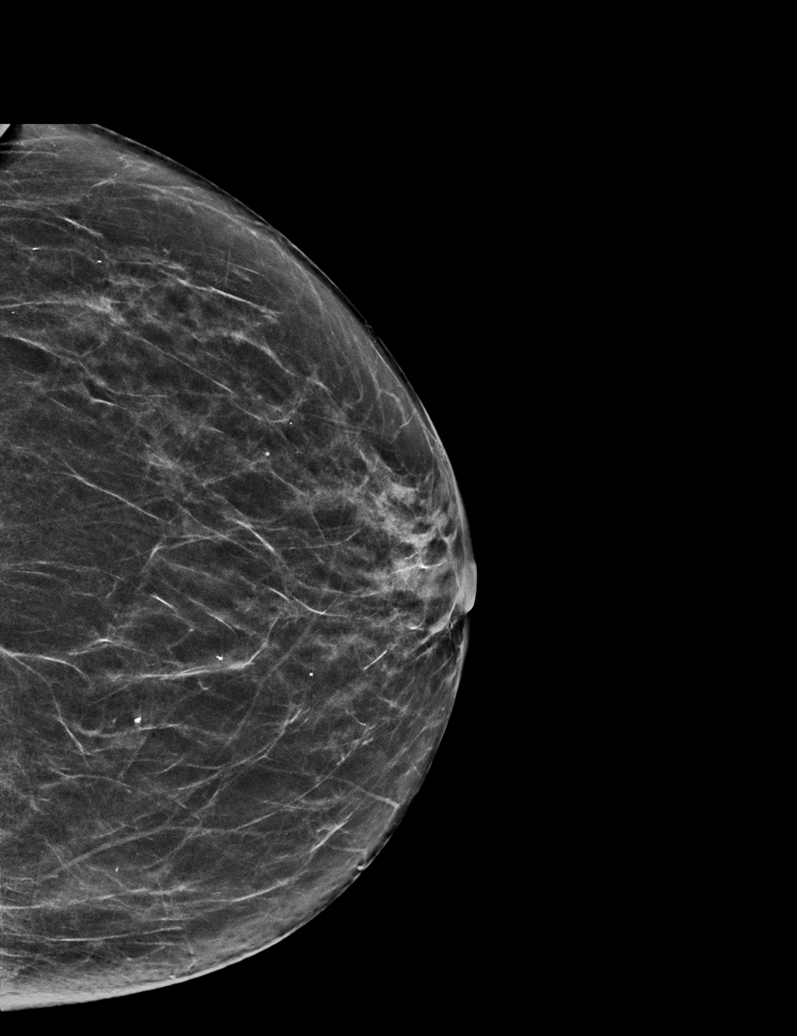

[R MLO synth-2D (2 of 2)]
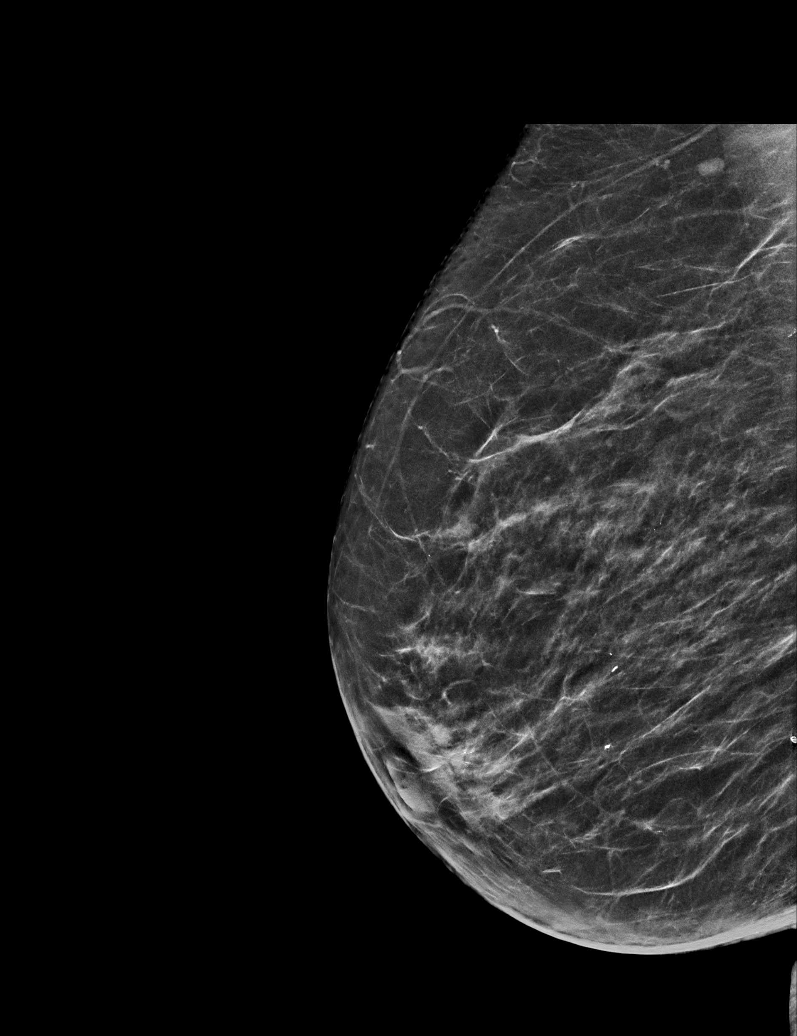

[L CC tomo · tomo slice 27/53.0]
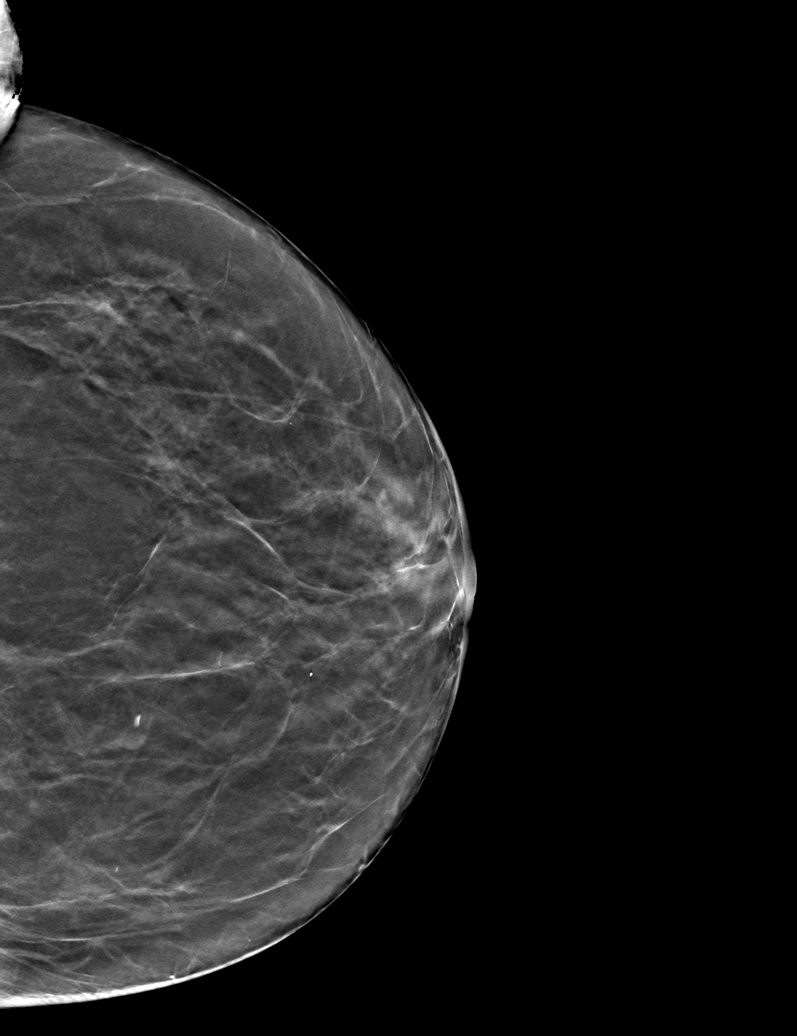

[6 of 30 positions shown; findings below may reference images not displayed]

ACR Breast Density Category b: There are scattered areas of
fibroglandular density.
FINDINGS: There are no findings suspicious for malignancy.
IMPRESSION: No mammographic evidence of malignancy. A result letter of this
screening mammogram will be mailed directly to the patient.

RECOMMENDATION:
Screening mammogram in one year. (Code:51-O-LD2)

BI-RADS CATEGORY  1: Negative.

## 2023-05-08 ENCOUNTER — Other Ambulatory Visit (HOSPITAL_COMMUNITY): Payer: Self-pay | Admitting: Internal Medicine

## 2023-05-08 DIAGNOSIS — Z1231 Encounter for screening mammogram for malignant neoplasm of breast: Secondary | ICD-10-CM

## 2023-05-15 ENCOUNTER — Ambulatory Visit (HOSPITAL_COMMUNITY)
Admission: RE | Admit: 2023-05-15 | Discharge: 2023-05-15 | Disposition: A | Discharge status: Home / Self Care | Payer: Medicare Other | Source: Ambulatory Visit | Attending: Internal Medicine | Admitting: Internal Medicine

## 2023-05-15 DIAGNOSIS — Z1231 Encounter for screening mammogram for malignant neoplasm of breast: Secondary | ICD-10-CM | POA: Insufficient documentation

## 2024-04-15 ENCOUNTER — Other Ambulatory Visit (HOSPITAL_COMMUNITY): Payer: Self-pay | Admitting: Family Medicine

## 2024-04-15 DIAGNOSIS — M810 Age-related osteoporosis without current pathological fracture: Secondary | ICD-10-CM

## 2024-04-19 ENCOUNTER — Ambulatory Visit
Admission: EM | Admit: 2024-04-19 | Discharge: 2024-04-19 | Disposition: A | Discharge status: Home / Self Care | Attending: Family Medicine | Admitting: Family Medicine

## 2024-04-19 DIAGNOSIS — N390 Urinary tract infection, site not specified: Secondary | ICD-10-CM | POA: Insufficient documentation

## 2024-04-19 LAB — POCT URINE DIPSTICK
Glucose, UA: NEGATIVE mg/dL
Nitrite, UA: NEGATIVE
POC PROTEIN,UA: >=300 — AB
Spec Grav, UA: 1.02 (ref 1.010–1.025)
Urobilinogen, UA: 1 U/dL
pH, UA: 5.5 (ref 5.0–8.0)

## 2024-04-19 MED ORDER — CEPHALEXIN 500 MG PO CAPS: 500.0000 mg | ORAL_CAPSULE | Freq: Two times a day (BID) | ORAL | 0 refills | Status: AC

## 2024-04-19 NOTE — ED Provider Notes (Signed)
 [facility]    CSN: [id number] Arrival date & time: 04/19/24  1319      History   Chief Complaint No chief complaint on file.   HPI Sherry Weiss is a 78 y.o. female.   Patient presenting today with several day history of lower back aching, suprapubic pressure and this morning foul urine odor and dysuria, urinary hesitancy.  Denies hematuria, fever, chills, nausea, vomiting, weakness, dizziness.  Was seen at a different urgent care about a week ago, treated with antibiotics for a urinary tract infection and called several days later to change antibiotics.  States she completed this entire course and did feel a bit better but symptoms started ramping back up the last day or so.  Not currently trying anything over-the-counter for symptoms.    Past Medical History:  Diagnosis Date   Hyperlipidemia    Hypertension    Osteoarthritis, knee    Type 2 diabetes mellitus (HCC)     Patient Active Problem List   Diagnosis Date Noted   S/P ORIF (open reduction internal fixation) fracture right patella 07/24/16 07/21/2017   Right leg weakness 08/29/2011   History of falling 08/29/2011   Abnormality of gait 08/29/2011   Orthostatic hypotension 07/11/2011   Type II or unspecified type diabetes mellitus without mention of complication, uncontrolled 07/11/2011   Mixed hyperlipidemia 07/11/2011   Osteoarthritis, knee 06/28/2011   Closed fracture of tibial tubercle 02/13/2011   Mononeuritis of lower limb 06/06/2010    Past Surgical History:  Procedure Laterality Date   CESAREAN SECTION     x2   ORIF PATELLA Right 07/24/2016   Procedure: OPEN REDUCTION INTERNAL (ORIF) FIXATION PATELLA;  Surgeon: Taft FORBES Minerva, MD;  Location: [facility];  Service: Orthopedics;  Laterality: Right;    OB History     Gravida  3   Para  3   Term  3   Preterm      AB      Living         SAB      IAB      Ectopic      Multiple      Live Births               Home  Medications    Prior to Admission medications   Medication Sig Start Date End Date Taking? Authorizing Provider  cephALEXin  (KEFLEX ) 500 MG capsule Take 1 capsule (500 mg total) by mouth 2 (two) times daily. 04/19/24  Yes Stuart Vernell Norris, PA-C  aspirin 81 MG tablet Take 81 mg by mouth daily.      [provider]  atorvastatin (LIPITOR) 40 MG tablet Take 40 mg by mouth daily at 6 PM.  06/26/16   [provider]  gabapentin (NEURONTIN) 300 MG capsule Take 300 mg by mouth 3 (three) times daily.  01/13/11   [provider]  losartan-hydrochlorothiazide (HYZAAR) 50-12.5 MG tablet Take 1 tablet by mouth daily.  06/11/16   [provider]  meloxicam (MOBIC) 15 MG tablet Take 15 mg by mouth daily.  01/07/11   [provider]    Family History Family History  Problem Relation Age of Onset   Heart disease Unknown    Arthritis Unknown    Cancer Unknown    Diabetes Unknown    Hypertension Mother    Cancer Sister     Social History Social History   Tobacco Use   Smoking status: Never   Smokeless tobacco: Never  Substance Use Topics   Alcohol use: No   Drug use: No     Allergies   Patient has no known allergies.   Review of Systems Review of Systems PER HPI  Physical Exam Triage Vital Signs ED Triage Vitals  Encounter Vitals Group     BP 04/19/24 1411 (!) 104/57     Girls Systolic BP Percentile --      Girls Diastolic BP Percentile --      Boys Systolic BP Percentile --      Boys Diastolic BP Percentile --      Pulse Rate 04/19/24 1411 (!) 107     Resp 04/19/24 1411 18     Temp 04/19/24 1411 98.4 F (36.9 C)     Temp Source 04/19/24 1411 Oral     SpO2 04/19/24 1411 95 %     Weight --      Height --      Head Circumference --      Peak Flow --      Pain Score 04/19/24 1413 0     Pain Loc --      Pain Education --      Exclude from Growth Chart --    No data found.  Updated Vital Signs BP 106/62 (BP Location: Right  Arm)   Pulse 99   Temp 98.4 F (36.9 C) (Oral)   Resp 18   SpO2 95%   Visual Acuity Right Eye Distance:   Left Eye Distance:   Bilateral Distance:    Right Eye Near:   Left Eye Near:    Bilateral Near:     Physical Exam Vitals and nursing note reviewed.  Constitutional:      Appearance: Normal appearance. She is not ill-appearing.  HENT:     Head: Atraumatic.  Eyes:     Extraocular Movements: Extraocular movements intact.     Conjunctiva/sclera: Conjunctivae normal.  Cardiovascular:     Rate and Rhythm: Normal rate.  Pulmonary:     Effort: Pulmonary effort is normal.  Abdominal:     General: Bowel sounds are normal. There is no distension.     Palpations: Abdomen is soft.     Tenderness: There is no abdominal tenderness. There is no right CVA tenderness, left CVA tenderness or guarding.  Musculoskeletal:        General: Normal range of motion.     Cervical back: Normal range of motion and neck supple.  Skin:    General: Skin is warm and dry.  Neurological:     Mental Status: She is alert and oriented to person, place, and time.  Psychiatric:        Mood and Affect: Mood normal.        Thought Content: Thought content normal.        Judgment: Judgment normal.      UC Treatments / Results  Labs (all labs ordered are listed, but only abnormal results are displayed) Labs Reviewed  POCT URINE DIPSTICK - Abnormal; Notable for the following components:      Result Value   Clarity, UA cloudy (*)    Bilirubin, UA small (*)    Ketones, POC UA trace (5) (*)    Blood, UA small (*)    POC PROTEIN,UA >=300 (*)    Leukocytes, UA Large (3+) (*)    All other components within normal limits  URINE CULTURE    EKG   Radiology No results found.  Procedures Procedures (including critical care  time)  Medications Ordered in UC Medications - No data to display  Initial Impression / Assessment and Plan / UC Course  I have reviewed the triage vital signs and the  nursing notes.  Pertinent labs & imaging results that were available during my care of the patient were reviewed by me and considered in my medical decision making (see chart for details).     Vitals and exam reassuring today, patient well-appearing and in no acute distress.  Urinalysis today with evidence of urinary tract infection, urine culture pending, treat with Keflex , fluids, supportive home care and return for worsening symptoms.  Final Clinical Impressions(s) / UC Diagnoses   Final diagnoses:  Acute lower UTI     Discharge Instructions      Take the full course of antibiotics, drink plenty of water, empty your bladder fully and frequently.  Will let you know if we need to make any changes to your medication based on your urine culture when we get those results back.  Follow-up with your primary care provider for a recheck particularly given 2 urinary tract infections back-to-back.    ED Prescriptions     Medication Sig Dispense Auth. Provider   cephALEXin  (KEFLEX ) 500 MG capsule Take 1 capsule (500 mg total) by mouth 2 (two) times daily. 10 capsule Stuart Vernell Norris, [place]      PDMP not reviewed this encounter.   Stuart Vernell Norris, [place] 04/19/24 1521

## 2024-04-19 NOTE — Discharge Instructions (Addendum)
 Take the full course of antibiotics, drink plenty of water, empty your bladder fully and frequently.  Will let you know if we need to make any changes to your medication based on your urine culture when we get those results back.  Follow-up with your primary care provider for a recheck particularly given 2 urinary tract infections back-to-back.

## 2024-04-19 NOTE — ED Triage Notes (Signed)
 Pt reports back pain, abdominal pain x 4 days. Was seen at at [facility] about a week ago for the same sx's. Was given antibiotics but has not found relief. Thinks sx's are affecting her BS. Stating that some times she becomes real shaky.

## 2024-04-20 ENCOUNTER — Other Ambulatory Visit (HOSPITAL_COMMUNITY): Payer: Self-pay | Admitting: Family Medicine

## 2024-04-20 DIAGNOSIS — Z1231 Encounter for screening mammogram for malignant neoplasm of breast: Secondary | ICD-10-CM

## 2024-04-21 ENCOUNTER — Ambulatory Visit (HOSPITAL_COMMUNITY): Payer: Self-pay

## 2024-04-21 LAB — URINE CULTURE: Culture: >=100000 — AB

## 2024-05-26 ENCOUNTER — Ambulatory Visit (HOSPITAL_COMMUNITY)
Admission: RE | Admit: 2024-05-26 | Discharge: 2024-05-26 | Disposition: A | Discharge status: Home / Self Care | Source: Ambulatory Visit | Attending: Family Medicine | Admitting: Family Medicine

## 2024-05-26 ENCOUNTER — Encounter (HOSPITAL_COMMUNITY): Payer: Self-pay

## 2024-05-26 DIAGNOSIS — M810 Age-related osteoporosis without current pathological fracture: Secondary | ICD-10-CM | POA: Diagnosis present

## 2024-05-26 DIAGNOSIS — Z1231 Encounter for screening mammogram for malignant neoplasm of breast: Secondary | ICD-10-CM | POA: Insufficient documentation
